# Patient Record
Sex: Female | Born: 1955 | Race: Black or African American | Hispanic: No | Marital: Single | Smoking: Current every day smoker
Health system: Southern US, Community
[De-identification: ages and names within clinical notes are randomized; demographics above are authoritative.]

## PROBLEM LIST (undated history)

## (undated) DIAGNOSIS — Z794 Long term (current) use of insulin: Secondary | ICD-10-CM

## (undated) DIAGNOSIS — J449 Chronic obstructive pulmonary disease, unspecified: Secondary | ICD-10-CM

## (undated) DIAGNOSIS — E039 Hypothyroidism, unspecified: Secondary | ICD-10-CM

## (undated) DIAGNOSIS — N840 Polyp of corpus uteri: Secondary | ICD-10-CM

## (undated) DIAGNOSIS — E785 Hyperlipidemia, unspecified: Secondary | ICD-10-CM

## (undated) DIAGNOSIS — E079 Disorder of thyroid, unspecified: Secondary | ICD-10-CM

## (undated) DIAGNOSIS — I10 Essential (primary) hypertension: Secondary | ICD-10-CM

## (undated) DIAGNOSIS — M1990 Unspecified osteoarthritis, unspecified site: Secondary | ICD-10-CM

## (undated) DIAGNOSIS — Z8742 Personal history of other diseases of the female genital tract: Secondary | ICD-10-CM

## (undated) DIAGNOSIS — M5136 Other intervertebral disc degeneration, lumbar region: Secondary | ICD-10-CM

## (undated) DIAGNOSIS — N950 Postmenopausal bleeding: Secondary | ICD-10-CM

## (undated) DIAGNOSIS — Z973 Presence of spectacles and contact lenses: Secondary | ICD-10-CM

## (undated) DIAGNOSIS — N393 Stress incontinence (female) (male): Secondary | ICD-10-CM

## (undated) DIAGNOSIS — Z9989 Dependence on other enabling machines and devices: Secondary | ICD-10-CM

## (undated) DIAGNOSIS — M51369 Other intervertebral disc degeneration, lumbar region without mention of lumbar back pain or lower extremity pain: Secondary | ICD-10-CM

## (undated) DIAGNOSIS — D259 Leiomyoma of uterus, unspecified: Secondary | ICD-10-CM

## (undated) DIAGNOSIS — Z6841 Body Mass Index (BMI) 40.0 and over, adult: Secondary | ICD-10-CM

## (undated) DIAGNOSIS — E559 Vitamin D deficiency, unspecified: Secondary | ICD-10-CM

## (undated) DIAGNOSIS — E119 Type 2 diabetes mellitus without complications: Secondary | ICD-10-CM

## (undated) DIAGNOSIS — IMO0002 Reserved for concepts with insufficient information to code with codable children: Secondary | ICD-10-CM

## (undated) DIAGNOSIS — T8859XA Other complications of anesthesia, initial encounter: Secondary | ICD-10-CM

## (undated) DIAGNOSIS — R7303 Prediabetes: Secondary | ICD-10-CM

## (undated) DIAGNOSIS — R2681 Unsteadiness on feet: Secondary | ICD-10-CM

## (undated) HISTORY — DX: Type 2 diabetes mellitus without complications: E11.9

## (undated) HISTORY — DX: Unspecified osteoarthritis, unspecified site: M19.90

## (undated) HISTORY — DX: Hyperlipidemia, unspecified: E78.5

## (undated) HISTORY — DX: Reserved for concepts with insufficient information to code with codable children: IMO0002

## (undated) HISTORY — DX: Disorder of thyroid, unspecified: E07.9

---

## 2001-12-04 ENCOUNTER — Ambulatory Visit (HOSPITAL_COMMUNITY): Admission: RE | Admit: 2001-12-04 | Discharge: 2001-12-04 | Payer: Self-pay | Admitting: Family Medicine

## 2003-05-14 ENCOUNTER — Emergency Department (HOSPITAL_COMMUNITY): Admission: EM | Admit: 2003-05-14 | Discharge: 2003-05-14 | Payer: Self-pay | Admitting: Emergency Medicine

## 2003-12-27 ENCOUNTER — Emergency Department (HOSPITAL_COMMUNITY): Admission: EM | Admit: 2003-12-27 | Discharge: 2003-12-27 | Payer: Self-pay | Admitting: Emergency Medicine

## 2010-12-24 ENCOUNTER — Emergency Department (HOSPITAL_COMMUNITY): Payer: Medicaid Other

## 2010-12-24 ENCOUNTER — Emergency Department (HOSPITAL_COMMUNITY)
Admission: EM | Admit: 2010-12-24 | Discharge: 2010-12-24 | Disposition: A | Discharge status: Home / Self Care | Payer: Medicaid Other | Attending: Emergency Medicine | Admitting: Emergency Medicine

## 2010-12-24 DIAGNOSIS — H538 Other visual disturbances: Secondary | ICD-10-CM | POA: Insufficient documentation

## 2010-12-24 DIAGNOSIS — H209 Unspecified iridocyclitis: Secondary | ICD-10-CM | POA: Insufficient documentation

## 2010-12-24 DIAGNOSIS — H5460 Unqualified visual loss, one eye, unspecified: Secondary | ICD-10-CM | POA: Insufficient documentation

## 2010-12-24 DIAGNOSIS — I10 Essential (primary) hypertension: Secondary | ICD-10-CM | POA: Insufficient documentation

## 2010-12-24 LAB — GLUCOSE, CAPILLARY: Glucose-Capillary: 122 mg/dL — ABNORMAL HIGH (ref 70–99)

## 2010-12-24 LAB — ANGIOTENSIN CONVERTING ENZYME: Angiotensin-Converting Enzyme: 48 U/L (ref 8–52)

## 2011-03-15 ENCOUNTER — Inpatient Hospital Stay (INDEPENDENT_AMBULATORY_CARE_PROVIDER_SITE_OTHER)
Admission: RE | Admit: 2011-03-15 | Discharge: 2011-03-15 | Disposition: A | Discharge status: Home / Self Care | Payer: Self-pay | Source: Ambulatory Visit | Attending: Family Medicine | Admitting: Family Medicine

## 2011-03-15 ENCOUNTER — Ambulatory Visit (INDEPENDENT_AMBULATORY_CARE_PROVIDER_SITE_OTHER): Payer: Self-pay

## 2011-03-15 DIAGNOSIS — IMO0002 Reserved for concepts with insufficient information to code with codable children: Secondary | ICD-10-CM

## 2011-03-22 NOTE — Consult Note (Signed)
[name], [name]               ACCOUNT NO.:  [id number]  MEDICAL RECORD NO.:  [id number]           PATIENT TYPE:  E  LOCATION:  [facility]                         FACILITY:  [facility]  PHYSICIAN:  Jeanne Highland. Goerge Mohr, M.D.   DATE OF BIRTH:  [birth year]  DATE OF CONSULTATION:  12/24/2010 DATE OF DISCHARGE:  12/24/2010                                CONSULTATION   ER CONSULTATION  A 55 year old black woman with 1-day history of profound vision loss left eye, onset suddenly and painlessly, no trauma to left eye prior to this event, the left eye her better eye prior to this, 2 day's onset with painless loss of vision, left eye.  PAST OCULAR HISTORY:  High myopia followed by Dr. Donalee [name] in [place], [place] with glasses as she is highly myopic.  She has had no previous ocular surgeries.  MEDICATIONS:  Oral Aleve or Advil p.r.n. right knee arthritis, osteoarthritis while she is at work upright and standing with her obese condition.  PHYSICAL EXAMINATION:  She was tested by the ER staff prior to my arrival, 20/50 right eye with glasses.  On the left eye was bare hand motion confirmed by me.  Pupils on the right eye are normal with no afferent pupillary defect directly or by reverse testing.  On the left eye, there is no movement, appears to be a secluded pupil.  Motility is normal.  Slit-lamp examination of the right eye is entirely normal. Left eye shows evidence of trace areas of the cornea, 3+ injection of the conjunctiva, plaque of white cells overlying the pupil, a secluded pupil, nearly 360 synechiae over the pupil although there appeared to be clefts which upon later dilation there is some clefting __________prevent the pupil iris bombe.  Dilation was then performed.  The right eye is again normal.  Retina is attached.  There are mild __________ macular changes, mild papillary changes, no holes or tears in the right eye and the vitreous is clear. The choroid and retina  are  normal in the right eye.  There is no view posteriorly through the plaque of the left eye.  Notably, the anterior chamber also had a trace to 1 mm hypopyon inferiorly.  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  This requires laboratory testing with CBC, sed rate, ACE, lysozyme, and skin test for PPD to be placed today.  To allow and to enhance clearing of the inflammatory component, topical steroids to be used on hourly basis while she is awake.  In addition to that, to confirm, maintain and improve and hasten its resolution, 1 mL of 40 mg/mL of Kenalog in retroseptal fashion through the lower lid atraumatically without pain.  The patient will be discharged home with topical eye drops and requested to come to see in the office in 3 days for followup.     Jeanne [name], M.D.     GAR/MEDQ  D:  12/24/2010  T:  12/25/2010  Job:  [id number]  Electronically Signed by Fawn Kirk M.D. on 03/22/2011 04:54:09  PM

## 2011-04-10 ENCOUNTER — Other Ambulatory Visit: Payer: Self-pay | Admitting: Internal Medicine

## 2011-04-10 DIAGNOSIS — Z1231 Encounter for screening mammogram for malignant neoplasm of breast: Secondary | ICD-10-CM

## 2011-04-20 ENCOUNTER — Ambulatory Visit: Payer: Medicaid Other

## 2011-04-25 ENCOUNTER — Ambulatory Visit
Admission: RE | Admit: 2011-04-25 | Discharge: 2011-04-25 | Disposition: A | Discharge status: Home / Self Care | Payer: Medicaid Other | Source: Ambulatory Visit | Attending: Internal Medicine | Admitting: Internal Medicine

## 2011-04-25 DIAGNOSIS — Z1231 Encounter for screening mammogram for malignant neoplasm of breast: Secondary | ICD-10-CM

## 2011-05-01 ENCOUNTER — Other Ambulatory Visit: Payer: Self-pay | Admitting: Internal Medicine

## 2011-05-01 DIAGNOSIS — R928 Other abnormal and inconclusive findings on diagnostic imaging of breast: Secondary | ICD-10-CM

## 2011-05-10 ENCOUNTER — Ambulatory Visit
Admission: RE | Admit: 2011-05-10 | Discharge: 2011-05-10 | Disposition: A | Discharge status: Home / Self Care | Payer: Medicaid Other | Source: Ambulatory Visit | Attending: Internal Medicine | Admitting: Internal Medicine

## 2011-05-10 ENCOUNTER — Other Ambulatory Visit: Payer: Medicaid Other

## 2011-05-10 DIAGNOSIS — R928 Other abnormal and inconclusive findings on diagnostic imaging of breast: Secondary | ICD-10-CM

## 2011-06-15 ENCOUNTER — Other Ambulatory Visit: Payer: Self-pay | Admitting: Orthopedic Surgery

## 2011-06-15 DIAGNOSIS — M545 Low back pain: Secondary | ICD-10-CM

## 2011-06-18 ENCOUNTER — Ambulatory Visit
Admission: RE | Admit: 2011-06-18 | Discharge: 2011-06-18 | Disposition: A | Discharge status: Home / Self Care | Payer: Medicaid Other | Source: Ambulatory Visit | Attending: Orthopedic Surgery | Admitting: Orthopedic Surgery

## 2011-06-18 ENCOUNTER — Other Ambulatory Visit: Payer: Medicaid Other

## 2011-06-18 DIAGNOSIS — M545 Low back pain: Secondary | ICD-10-CM

## 2011-09-16 ENCOUNTER — Emergency Department (HOSPITAL_COMMUNITY)
Admission: EM | Admit: 2011-09-16 | Discharge: 2011-09-17 | Disposition: A | Discharge status: Home / Self Care | Payer: Self-pay | Attending: Emergency Medicine | Admitting: Emergency Medicine

## 2011-09-16 ENCOUNTER — Encounter (HOSPITAL_COMMUNITY): Payer: Self-pay | Admitting: Emergency Medicine

## 2011-09-16 DIAGNOSIS — H53149 Visual discomfort, unspecified: Secondary | ICD-10-CM | POA: Insufficient documentation

## 2011-09-16 DIAGNOSIS — H5710 Ocular pain, unspecified eye: Secondary | ICD-10-CM | POA: Insufficient documentation

## 2011-09-16 DIAGNOSIS — Z79899 Other long term (current) drug therapy: Secondary | ICD-10-CM | POA: Insufficient documentation

## 2011-09-16 DIAGNOSIS — H15009 Unspecified scleritis, unspecified eye: Secondary | ICD-10-CM | POA: Insufficient documentation

## 2011-09-16 DIAGNOSIS — I10 Essential (primary) hypertension: Secondary | ICD-10-CM | POA: Insufficient documentation

## 2011-09-16 DIAGNOSIS — H15001 Unspecified scleritis, right eye: Secondary | ICD-10-CM

## 2011-09-16 DIAGNOSIS — H5789 Other specified disorders of eye and adnexa: Secondary | ICD-10-CM | POA: Insufficient documentation

## 2011-09-16 HISTORY — DX: Essential (primary) hypertension: I10

## 2011-09-16 HISTORY — DX: Prediabetes: R73.03

## 2011-09-16 NOTE — ED Notes (Signed)
Patient with right eye pain, drainage, injected.  Patient has been using drops that she has had at home.

## 2011-09-16 NOTE — ED Notes (Signed)
EDP at bedside  

## 2011-09-17 MED ORDER — ERYTHROMYCIN 5 MG/GM OP OINT: TOPICAL_OINTMENT | OPHTHALMIC | Status: AC

## 2011-09-17 MED ORDER — TETRACAINE HCL 0.5 % OP SOLN
1.0000 [drp] | Freq: Once | OPHTHALMIC | Status: AC
  Administered 2011-09-17: 1 [drp] via OPHTHALMIC
  Filled 2011-09-17: qty 2

## 2011-09-17 MED ORDER — FLUORESCEIN SODIUM 1 MG OP STRP
1.0000 | ORAL_STRIP | Freq: Once | OPHTHALMIC | Status: AC
  Administered 2011-09-17: 1 via OPHTHALMIC
  Filled 2011-09-17: qty 1

## 2011-09-17 MED ORDER — HYDROCODONE-ACETAMINOPHEN 5-325 MG PO TABS
1.0000 | ORAL_TABLET | Freq: Once | ORAL | Status: AC
  Administered 2011-09-17: 1 via ORAL
  Filled 2011-09-17: qty 1

## 2011-09-17 MED ORDER — HYDROCODONE-ACETAMINOPHEN 5-325 MG PO TABS: 1.0000 | ORAL_TABLET | Freq: Four times a day (QID) | ORAL | Status: AC | PRN

## 2011-09-17 NOTE — ED Provider Notes (Signed)
History     CSN: [id number]  Arrival date & time 09/16/11  2239   First MD Initiated Contact with Patient 09/16/11 2316      Chief Complaint  Patient presents with  . Eye Pain    (Consider location/radiation/quality/duration/timing/severity/associated sxs/prior treatment) The history is provided by the patient.   right eye pain and irritation for 5 days. Similar problem approximately one to 2 years ago without specific findings. Patient wears eyeglasses and has an optometrist but they would not see her, she does not wear contacts. No purulent discharge some mild photophobia pain around the right eye socket no redness or swelling. No nausea vomiting no fever. No history of trauma to the eye or foreign body. Eye pain is 10 out of 10. Improves some with Motrin that she's taken at home. Pain is described as throbbing sharp.  Past Medical History  Diagnosis Date  . Borderline diabetic   . Hypertension     Past Surgical History  Procedure Date  . Cesarean section     No family history on file.  History  Substance Use Topics  . Smoking status: Current Everyday Smoker    Types: Cigarettes  . Smokeless tobacco: Not on file  . Alcohol Use: No    OB History    Grav Para Term Preterm Abortions TAB SAB Ect Mult Living                  Review of Systems  Constitutional: Negative for fever.  HENT: Negative for congestion and neck pain.   Eyes: Positive for photophobia, pain and redness. Negative for discharge.  Respiratory: Negative for cough and shortness of breath.   Cardiovascular: Negative for chest pain.  Gastrointestinal: Negative for abdominal pain.  Genitourinary: Negative for dysuria.  Musculoskeletal: Negative for back pain.  Neurological: Negative for dizziness, facial asymmetry, speech difficulty and headaches.  Hematological: Does not bruise/bleed easily.    Allergies  Review of patient's allergies indicates no known allergies.  Home Medications   Current  Outpatient Rx  Name Route Sig Dispense Refill  . CYCLOBENZAPRINE HCL 10 MG PO TABS Oral Take 10 mg by mouth at bedtime as needed. For sore muscles    . DICLOFENAC SODIUM 25 MG PO TBEC Oral Take 25 mg by mouth 2 (two) times daily.    . IBUPROFEN 200 MG PO TABS Oral Take 600 mg by mouth every 8 (eight) hours as needed. For pain    . LISINOPRIL 20 MG PO TABS Oral Take 20 mg by mouth daily.    Marland Kitchen PREDNISOLONE ACETATE 1 % OP SUSP Right Eye Place 1 drop into the right eye once.    . TETRAHYDROZOLINE HCL 0.05 % OP SOLN Right Eye Place 1 drop into the right eye 4 (four) times daily.    . ERYTHROMYCIN 5 MG/GM OP OINT  Place a 1/2 inch ribbon of ointment into the lower eyelid. 1 g 0  . HYDROCODONE-ACETAMINOPHEN 5-325 MG PO TABS Oral Take 1-2 tablets by mouth every 6 (six) hours as needed for pain. 10 tablet 0    BP 151/90  Pulse 100  Temp(Src) 98.1 F (36.7 C) (Oral)  Resp 16  SpO2 94%  Physical Exam  Nursing note and vitals reviewed. Constitutional: She is oriented to person, place, and time. She appears well-developed and well-nourished.  HENT:  Head: Normocephalic and atraumatic.  Mouth/Throat: Oropharynx is clear and moist.  Eyes: EOM are normal. Pupils are equal, round, and reactive to light. Right eye exhibits  no discharge. Left eye exhibits no discharge. No scleral icterus.       Right eye lids not swollen no tenderness palpation no evidence of stye no hyphema, no purulent discharge, sclera is red in the eyes injected, by slit lamp with fluorosein staining no corneal abrasion or ulcer anterior chamber normal no hyphema, ocular pressure checked and it was 16.   Neck: Normal range of motion. Neck supple.  Cardiovascular: Normal rate, regular rhythm and normal heart sounds.   No murmur heard. Pulmonary/Chest: Effort normal and breath sounds normal.  Abdominal: Soft. Bowel sounds are normal. There is no tenderness.  Musculoskeletal: Normal range of motion. She exhibits no tenderness.    Neurological: She is alert and oriented to person, place, and time. No cranial nerve deficit. She exhibits normal muscle tone. Coordination normal.  Skin: Skin is warm. No rash noted. No erythema.    ED Course  Procedures (including critical care time)  Labs Reviewed - No data to display No results found.   1. Scleritis of right eye       MDM   Patient with pain and redness without purulent discharge to the right eye. Extensive workup in the emergency department to include slit lamp examination with a normal anterior chamber no corneal abrasion I done without relief of the pain and the fluorosein dye was used. Considered it being an iritis but shining a light into the left eye does not cause pain in the right eye. Extraocular muscles are intact pupils are equal reactive round to light and accommodate. Ocular pressure checked in the right eye it was 16. Therefore no evidence of glaucoma. Suspect inflammation of the sclera no swelling of the lids no pain with extraocular motion.  Based on today's workup no evidence of corneal abrasion or ulcer not consistent with an iritis not consistent with conjunctivitis not consistent with glaucoma. Suspect inflammation of the sclera will treat with erythromycin ointment have patient stop the Pred Forte drops that she started on Wednesday and referral given to ophthalmology.        Shelda Jakes, MD 09/17/11 [phone]

## 2011-09-17 NOTE — ED Notes (Signed)
EDP at bedside  

## 2011-12-05 ENCOUNTER — Ambulatory Visit (INDEPENDENT_AMBULATORY_CARE_PROVIDER_SITE_OTHER): Payer: Self-pay | Admitting: *Deleted

## 2011-12-05 ENCOUNTER — Other Ambulatory Visit: Payer: Self-pay | Admitting: Obstetrics and Gynecology

## 2011-12-05 VITALS — BP 135/83 | HR 100 | Temp 99.1°F | Ht 69.0 in | Wt 369.3 lb

## 2011-12-05 DIAGNOSIS — N898 Other specified noninflammatory disorders of vagina: Secondary | ICD-10-CM

## 2011-12-05 DIAGNOSIS — N63 Unspecified lump in unspecified breast: Secondary | ICD-10-CM

## 2011-12-05 DIAGNOSIS — Z01419 Encounter for gynecological examination (general) (routine) without abnormal findings: Secondary | ICD-10-CM

## 2011-12-05 NOTE — Patient Instructions (Addendum)
Taught patient how to perform BSE and gave educational materials to take home. Let her know [facility] will cover Pap smears every 3 years unless has a history of abnormal Pap smears. Patient referred to the [facility] for follow up right breast diagnostic mammogram. Appointment scheduled Thursday, December 07, 2011 at 0920. Patient aware of appointment and will be there. Let patient know will follow up with her within the next couple weeks with results. Patient verbalized understanding.

## 2011-12-05 NOTE — Progress Notes (Signed)
Complaints of dry irritated skin under breasts and inside of thighs. Patient referred from the [facility] for 6 month follow up right breast diagnostic mammogram from 05/10/11.  Pap Smear:    Completed Pap smear today. Per patient last Pap smear was 10 years ago and normal. Per patient has a history of abnormal Pap smears that have only required repeat Pap smears. No Pap smear results in EPIC.  Physical exam: Breasts Breasts symmetrical. Dry irritated skin that is broken in areas under bilateral breasts that has a yeast like appearance. No nipple retraction bilateral breasts. No nipple discharge bilateral breasts. No lymphadenopathy. No lumps palpated bilateral breasts. Patient referred to the [facility] for follow up right breast diagnostic mammogram. Appointment scheduled Thursday, December 07, 2011 at 0920.          Pelvic/Bimanual   Ext Genitalia No lesions, no swelling and no discharge observed on external genitalia. Skin dry and irritated with the same yeast like appearance under breasts.        Vagina Vagina pink and normal texture. No lesions and thick yellow discharge observed in vagina. Wet prep completed.          Cervix Cervix is present. Cervix pink and of normal texture. Whitish colored discharge observed at cervical os.          Uterus Uterus is present and difficult to palpate due to patient being morbidly obese. Uterus in normal position and felt to be of normal size.       Adnexae Bilateral ovaries present and unable to palpate due to obesity. No tenderness on palpation.        Rectovaginal No rectal exam completed today since patient had no rectal complaints. No skin abnormalities observed on exam.

## 2011-12-07 ENCOUNTER — Ambulatory Visit
Admission: RE | Admit: 2011-12-07 | Discharge: 2011-12-07 | Disposition: A | Discharge status: Home / Self Care | Payer: No Typology Code available for payment source | Source: Ambulatory Visit | Attending: Obstetrics and Gynecology | Admitting: Obstetrics and Gynecology

## 2011-12-07 ENCOUNTER — Telehealth: Payer: Self-pay | Admitting: *Deleted

## 2011-12-07 ENCOUNTER — Other Ambulatory Visit: Payer: Self-pay | Admitting: Obstetrics and Gynecology

## 2011-12-07 DIAGNOSIS — N63 Unspecified lump in unspecified breast: Secondary | ICD-10-CM

## 2011-12-07 LAB — WET PREP, GENITAL: Trich, Wet Prep: NONE SEEN

## 2011-12-07 MED ORDER — FLUCONAZOLE 150 MG PO TABS: 150.0000 mg | ORAL_TABLET | Freq: Once | ORAL | Status: AC

## 2011-12-07 MED ORDER — METRONIDAZOLE 500 MG PO TABS: 500.0000 mg | ORAL_TABLET | Freq: Two times a day (BID) | ORAL | Status: AC

## 2011-12-07 NOTE — Telephone Encounter (Signed)
Called patient and let patient know her Pap smear was normal. Gave her results to wet prep letting her know it showed yeast and BV. Told patient prescriptions have been sent to her pharmacy for Diflucan and Flagyl for her to pick. Told patient to avoid alcohol while taking Flagyl. Followed up with patient about results to diagnostic mammogram today. Reminded her she will need a diagnostic mammogram and right breast ultrasound in 6 months the end of October. Let her know [facility] will cover and that she can call the [facility] directly to schedule. Patient verbalized understanding.

## 2011-12-07 NOTE — Progress Notes (Signed)
Addended by: Catalina Antigua on: 12/07/2011 09:26 AM   Modules accepted: Orders

## 2012-01-13 ENCOUNTER — Emergency Department (INDEPENDENT_AMBULATORY_CARE_PROVIDER_SITE_OTHER)
Admission: EM | Admit: 2012-01-13 | Discharge: 2012-01-13 | Disposition: A | Discharge status: Home / Self Care | Payer: Self-pay | Source: Home / Self Care | Attending: Emergency Medicine | Admitting: Emergency Medicine

## 2012-01-13 ENCOUNTER — Encounter (HOSPITAL_COMMUNITY): Payer: Self-pay | Admitting: *Deleted

## 2012-01-13 DIAGNOSIS — J40 Bronchitis, not specified as acute or chronic: Secondary | ICD-10-CM

## 2012-01-13 DIAGNOSIS — J029 Acute pharyngitis, unspecified: Secondary | ICD-10-CM

## 2012-01-13 MED ORDER — AMOXICILLIN 500 MG PO CAPS: 500.0000 mg | ORAL_CAPSULE | Freq: Three times a day (TID) | ORAL | Status: AC

## 2012-01-13 MED ORDER — GUAIFENESIN-CODEINE 100-10 MG/5ML PO SYRP: 5.0000 mL | ORAL_SOLUTION | Freq: Three times a day (TID) | ORAL | Status: AC | PRN

## 2012-01-13 NOTE — ED Notes (Signed)
Pt with c/o sore throat /cough/congestion 10  Days  - per pt coughing more at night - coughing spells causing vomiting - felt better yesterday worse today -

## 2012-01-13 NOTE — Discharge Instructions (Signed)
Bronchitis Bronchitis is a problem of the air tubes leading to your lungs. This problem makes it hard for air to get in and out of the lungs. You may cough a lot because your air tubes are narrow. Going without care can cause lasting (chronic) bronchitis. HOME CARE   Drink enough fluids to keep your pee (urine) clear or pale yellow.   Use a cool mist humidifier.   Quit smoking if you smoke. If you keep smoking, the bronchitis might not get better.   Only take medicine as told by your doctor.  GET HELP RIGHT AWAY IF:   Coughing keeps you awake.   You start to wheeze.   You become more sick or weak.   You have a hard time breathing or get short of breath.   You cough up blood.   Coughing lasts more than 2 weeks.   You have a fever.   Your baby is older than 3 months with a rectal temperature of 102 F (38.9 C) or higher.   Your baby is 38 months old or younger with a rectal temperature of 100.4 F (38 C) or higher.  MAKE SURE YOU:  Understand these instructions.   Will watch your condition.   Will get help right away if you are not doing well or get worse.  Document Released: 01/17/2008 Document Revised: 07/20/2011 Document Reviewed: 07/02/2009 [facility] Patient Information 2012 [place], [place].Pharyngitis, Viral and Bacterial Pharyngitis is soreness (inflammation) or infection of the pharynx. It is also called a sore throat. CAUSES  Most sore throats are caused by viruses and are part of a cold. However, some sore throats are caused by strep and other bacteria. Sore throats can also be caused by post nasal drip from draining sinuses, allergies and sometimes from sleeping with an open mouth. Infectious sore throats can be spread from person to person by coughing, sneezing and sharing cups or eating utensils. TREATMENT  Sore throats that are viral usually last 3-4 days. Viral illness will get better without medications (antibiotics). Strep throat and other bacterial infections  will usually begin to get better about 24-48 hours after you begin to take antibiotics. HOME CARE INSTRUCTIONS   If the caregiver feels there is a bacterial infection or if there is a positive strep test, they will prescribe an antibiotic. The full course of antibiotics must be taken. If the full course of antibiotic is not taken, you or your child may become ill again. If you or your child has strep throat and do not finish all of the medication, serious heart or kidney diseases may develop.   Drink enough water and fluids to keep your urine clear or pale yellow.   Only take over-the-counter or prescription medicines for pain, discomfort or fever as directed by your caregiver.   Get lots of rest.   Gargle with salt water ( tsp. of salt in a glass of water) as often as every 1-2 hours as you need for comfort.   Hard candies may soothe the throat if individual is not at risk for choking. Throat sprays or lozenges may also be used.  SEEK MEDICAL CARE IF:   Large, tender lumps in the neck develop.   A rash develops.   Green, yellow-brown or bloody sputum is coughed up.   Your baby is older than 3 months with a rectal temperature of 100.5 F (38.1 C) or higher for more than 1 day.  SEEK IMMEDIATE MEDICAL CARE IF:   A stiff neck develops.  You or your child are drooling or unable to swallow liquids.   You or your child are vomiting, unable to keep medications or liquids down.   You or your child has severe pain, unrelieved with recommended medications.   You or your child are having difficulty breathing (not due to stuffy nose).   You or your child are unable to fully open your mouth.   You or your child develop redness, swelling, or severe pain anywhere on the neck.   You have a fever.   Your baby is older than 3 months with a rectal temperature of 102 F (38.9 C) or higher.   Your baby is 41 months old or younger with a rectal temperature of 100.4 F (38 C) or higher.    MAKE SURE YOU:   Understand these instructions.   Will watch your condition.   Will get help right away if you are not doing well or get worse.  Document Released: 07/31/2005 Document Revised: 07/20/2011 Document Reviewed: 10/28/2007 [facility] Patient Information 2012 [place], [place].

## 2012-01-13 NOTE — ED Provider Notes (Signed)
History     CSN: [id number]  Arrival date & time 01/13/12  1746   First MD Initiated Contact with Patient 01/13/12 1750      Chief Complaint  Patient presents with  . Sore Throat  . Cough  . Nasal Congestion    (Consider location/radiation/quality/duration/timing/severity/associated sxs/prior treatment) HPI Comments: Been coughing for about 10 days the cough has become progressively worse and now bringing up yellow to green phlegm. My chest feels congested have no shortness of breath or wheezing. At night get coughing spells to the point that makes me vomit. For the last 2 days moist throat has been hurting more so in the left side and when I swallow in the left side of my neck hurts. No shortness of breath, no abdominal pain no fevers that I can tell.  Patient is a 56 y.o. female presenting with pharyngitis and cough. The history is provided by the patient.  Sore Throat This is a recurrent problem. The current episode started more than 1 week ago. The problem occurs constantly. The problem has been gradually worsening. Pertinent negatives include no chest pain, no abdominal pain and no shortness of breath. The symptoms are relieved by nothing. She has tried nothing for the symptoms.  Cough Associated symptoms include sore throat. Pertinent negatives include no chest pain, no chills and no shortness of breath.    Past Medical History  Diagnosis Date  . Borderline diabetic   . Hypertension   . Hyperlipidemia   . Arthritis   . Abnormal Pap smear     Past Surgical History  Procedure Date  . Cesarean section     Family History  Problem Relation Age of Onset  . Hypertension Maternal Grandmother   . Alcohol abuse Maternal Grandmother   . Diabetes Mother   . Heart disease Mother   . Hypertension Mother   . Alcohol abuse Mother   . Heart disease Sister   . Hypertension Maternal Aunt   . Alcohol abuse Brother   . Alcohol abuse Sister     History  Substance Use Topics  .  Smoking status: Current Everyday Smoker -- 1.0 packs/day for 41 years    Types: Cigarettes  . Smokeless tobacco: Never Used  . Alcohol Use: No    OB History    Grav Para Term Preterm Abortions TAB SAB Ect Mult Living   2 2 2       2       Review of Systems  Constitutional: Negative for fever, chills, activity change, appetite change and fatigue.  HENT: Positive for congestion and sore throat. Negative for mouth sores, neck pain and sinus pressure.   Respiratory: Positive for cough. Negative for apnea, chest tightness and shortness of breath.   Cardiovascular: Negative for chest pain and palpitations.  Gastrointestinal: Negative for abdominal pain and anal bleeding.  Skin: Negative for rash.    Allergies  Review of patient's allergies indicates no known allergies.  Home Medications   Current Outpatient Rx  Name Route Sig Dispense Refill  . CYCLOBENZAPRINE HCL 10 MG PO TABS Oral Take 10 mg by mouth at bedtime as needed. For sore muscles    . DICLOFENAC SODIUM 25 MG PO TBEC Oral Take 25 mg by mouth 2 (two) times daily.    Marland Kitchen BENADRYL ALLERGY PO Oral Take by mouth.    . IBUPROFEN 200 MG PO TABS Oral Take 600 mg by mouth every 8 (eight) hours as needed. For pain    . LISINOPRIL 20  MG PO TABS Oral Take 20 mg by mouth daily.    Marland Kitchen LISINOPRIL-HYDROCHLOROTHIAZIDE 20-12.5 MG PO TABS Oral Take 1 tablet by mouth daily.    . NYQUIL PO Oral Take by mouth.    . DAYQUIL PO Oral Take by mouth.    . AMOXICILLIN 500 MG PO CAPS Oral Take 1 capsule (500 mg total) by mouth 3 (three) times daily. 30 capsule 0  . GUAIFENESIN-CODEINE 100-10 MG/5ML PO SYRP Oral Take 5 mLs by mouth 3 (three) times daily as needed for cough or congestion. 120 mL 0  . PREDNISOLONE ACETATE 1 % OP SUSP Right Eye Place 1 drop into the right eye once.    . TETRAHYDROZOLINE HCL 0.05 % OP SOLN Right Eye Place 1 drop into the right eye 4 (four) times daily.      BP 132/87  Pulse 104  Temp(Src) 98.8 F (37.1 C) (Oral)  Resp 20   SpO2 97%  Physical Exam  Nursing note and vitals reviewed. Constitutional: Vital signs are normal.  Non-toxic appearance. She does not have a sickly appearance. No distress.  HENT:  Head: Normocephalic.  Mouth/Throat: Uvula is midline and mucous membranes are normal. Posterior oropharyngeal erythema present. No oropharyngeal exudate or tonsillar abscesses.  Eyes: Conjunctivae are normal.  Pulmonary/Chest: Effort normal. No respiratory distress. She has decreased breath sounds. She has no wheezes. She has no rales.   She exhibits no tenderness.  Lymphadenopathy:    She has no cervical adenopathy.  Neurological: She is alert.    ED Course  Procedures (including critical care time)   Labs Reviewed  POCT RAPID STREP A ([facility] CARE ONLY)   No results found.   1. Bronchitis   2. Pharyngitis       MDM  Patient with productive cough for 10 days. On exam unilateral pharyngitis with reactive lymphadenopathy so the left anterior cervical chain. Decided to start patient on antibiotics encourage her to followup with primary care Dr. or [place] if no improvement is noted after 5-7 days. Patient agree with treatment plan and followup care as necessary.        Jimmie Molly, MD 01/13/12 [phone]

## 2012-05-02 ENCOUNTER — Other Ambulatory Visit: Payer: Self-pay | Admitting: Obstetrics and Gynecology

## 2012-05-02 ENCOUNTER — Telehealth (HOSPITAL_COMMUNITY): Payer: Self-pay | Admitting: *Deleted

## 2012-05-02 DIAGNOSIS — R922 Inconclusive mammogram: Secondary | ICD-10-CM

## 2012-05-02 NOTE — Telephone Encounter (Signed)
Telephoned patient at home # and spoke with patient. Advised of diagnostic bilateral mammogram with ultrasound that needed to be scheduled in Oct. Patient will call and schedule.

## 2012-05-16 ENCOUNTER — Ambulatory Visit
Admission: RE | Admit: 2012-05-16 | Discharge: 2012-05-16 | Disposition: A | Discharge status: Home / Self Care | Payer: No Typology Code available for payment source | Source: Ambulatory Visit | Attending: Obstetrics and Gynecology | Admitting: Obstetrics and Gynecology

## 2012-05-16 DIAGNOSIS — R9230 Dense breasts, unspecified: Secondary | ICD-10-CM

## 2012-05-16 DIAGNOSIS — R922 Inconclusive mammogram: Secondary | ICD-10-CM

## 2012-06-19 ENCOUNTER — Telehealth (HOSPITAL_COMMUNITY): Payer: Self-pay | Admitting: *Deleted

## 2012-06-19 NOTE — Telephone Encounter (Signed)
Patient returned call and advised mammogram was benign and recommendation was for bilateral diagnostic mammogram in one year. Patient voiced understanding.

## 2013-08-21 ENCOUNTER — Other Ambulatory Visit: Payer: Self-pay | Admitting: Internal Medicine

## 2013-08-21 DIAGNOSIS — N63 Unspecified lump in unspecified breast: Secondary | ICD-10-CM

## 2013-08-29 ENCOUNTER — Ambulatory Visit
Admission: RE | Admit: 2013-08-29 | Discharge: 2013-08-29 | Disposition: A | Discharge status: Home / Self Care | Payer: Medicare HMO | Source: Ambulatory Visit | Attending: Internal Medicine | Admitting: Internal Medicine

## 2013-08-29 DIAGNOSIS — N63 Unspecified lump in unspecified breast: Secondary | ICD-10-CM

## 2014-06-15 ENCOUNTER — Encounter (HOSPITAL_COMMUNITY): Payer: Self-pay | Admitting: *Deleted

## 2014-11-16 ENCOUNTER — Other Ambulatory Visit: Payer: Self-pay

## 2014-11-16 DIAGNOSIS — Z1231 Encounter for screening mammogram for malignant neoplasm of breast: Secondary | ICD-10-CM

## 2014-11-20 ENCOUNTER — Ambulatory Visit
Admission: RE | Admit: 2014-11-20 | Discharge: 2014-11-20 | Disposition: A | Discharge status: Home / Self Care | Payer: Medicare HMO | Source: Ambulatory Visit

## 2014-11-20 DIAGNOSIS — Z1231 Encounter for screening mammogram for malignant neoplasm of breast: Secondary | ICD-10-CM

## 2014-12-10 ENCOUNTER — Other Ambulatory Visit: Payer: Self-pay | Admitting: Internal Medicine

## 2014-12-10 DIAGNOSIS — E2839 Other primary ovarian failure: Secondary | ICD-10-CM

## 2014-12-16 ENCOUNTER — Ambulatory Visit
Admission: RE | Admit: 2014-12-16 | Discharge: 2014-12-16 | Disposition: A | Discharge status: Home / Self Care | Payer: Medicare HMO | Source: Ambulatory Visit | Attending: Internal Medicine | Admitting: Internal Medicine

## 2014-12-16 DIAGNOSIS — E2839 Other primary ovarian failure: Secondary | ICD-10-CM

## 2015-05-05 ENCOUNTER — Ambulatory Visit: Payer: Medicare HMO | Admitting: Podiatry

## 2015-06-22 ENCOUNTER — Ambulatory Visit: Payer: Medicare HMO | Admitting: Podiatry

## 2015-07-27 ENCOUNTER — Ambulatory Visit (INDEPENDENT_AMBULATORY_CARE_PROVIDER_SITE_OTHER): Payer: Medicare HMO | Admitting: Podiatry

## 2015-07-27 ENCOUNTER — Encounter: Payer: Self-pay | Admitting: Podiatry

## 2015-07-27 DIAGNOSIS — B351 Tinea unguium: Secondary | ICD-10-CM

## 2015-07-27 DIAGNOSIS — M79674 Pain in right toe(s): Secondary | ICD-10-CM | POA: Diagnosis not present

## 2015-07-27 DIAGNOSIS — M79675 Pain in left toe(s): Secondary | ICD-10-CM | POA: Diagnosis not present

## 2015-07-27 DIAGNOSIS — E119 Type 2 diabetes mellitus without complications: Secondary | ICD-10-CM | POA: Diagnosis not present

## 2015-07-27 DIAGNOSIS — B353 Tinea pedis: Secondary | ICD-10-CM

## 2015-07-27 NOTE — Progress Notes (Signed)
   Subjective:    Patient ID: Jeanne [name], female    DOB: 05/30/1956, 59 y.o.   MRN: [id number]  HPI   This patient presents today requesting debridement of toenails which she says they are occasionally uncomfortable especially the margin of the right hallux nail. She goes to a pedicures in the past for this service. She denies any bacterial infection or professional care for her toenails. Also, patient was complaining of itching in the third and fourth webspaces bilaterally that she applied clotrimazole/betamethasone cream daily for multiple months without any resolution of the symptoms.  Patient is a diabetic and denies any history of foot ulceration, claudication or amputation  Review of Systems  Eyes: Positive for pain, redness, itching and visual disturbance.  Gastrointestinal: Positive for constipation.  Endocrine: Positive for heat intolerance.  Musculoskeletal: Positive for back pain and gait problem.  Neurological: Positive for weakness.       Objective:   Physical Exam  Orientated 3  Vascular: DP and PT pulses 2/4 bilaterally Capillary reflex immediate bilaterally  Neurological: Sensation to 10 g monofilament wire intact 5/5 bilaterally Vibratory sensation reactive bilaterally Ankle reflex equal and reactive bilaterally  Dermatological: No open skin lesions bilaterally Dry skin bilaterally The toenails are elongated, incurvated with occasional color changes. The medial left hallux nail groove is callused The third and fourth right webspace have a white macerated appearance without any open lesions.  Musculoskeletal: There is no crepitus or pain upon range of motion of ankle, subtalar, midtarsal joints bilaterally       Assessment & Plan:   Assessment: Satisfactory neurovascular status Diabetic without foot complication Probable yeast infection third and fourth webspaces bilaterally Incurvated toenails 6-10  Plan: Today review the results of  examination with patient today. I did debride the nails without any bleeding I recommended that she apply Castellani  Paint to the webspaces twice a day 60 days. I made aware that she can purchase this product as an over-the-counter product  Reappoint 3 month

## 2015-07-27 NOTE — Patient Instructions (Signed)
Purchase over-the-counter [employer] and apply in between the third and fourth toes on the right and left feet with a Q-tip twice a day 60 days  Diabetes and Foot Care Diabetes may cause you to have problems because of poor blood supply (circulation) to your feet and legs. This may cause the skin on your feet to become thinner, break easier, and heal more slowly. Your skin may become dry, and the skin may peel and crack. You may also have nerve damage in your legs and feet causing decreased feeling in them. You may not notice minor injuries to your feet that could lead to infections or more serious problems. Taking care of your feet is one of the most important things you can do for yourself.  HOME CARE INSTRUCTIONS  Wear shoes at all times, even in the house. Do not go barefoot. Bare feet are easily injured.  Check your feet daily for blisters, cuts, and redness. If you cannot see the bottom of your feet, use a mirror or ask someone for help.  Wash your feet with warm water (do not use hot water) and mild soap. Then pat your feet and the areas between your toes until they are completely dry. Do not soak your feet as this can dry your skin.  Apply a moisturizing lotion or petroleum jelly (that does not contain alcohol and is unscented) to the skin on your feet and to dry, brittle toenails. Do not apply lotion between your toes.  Trim your toenails straight across. Do not dig under them or around the cuticle. File the edges of your nails with an emery board or nail file.  Do not cut corns or calluses or try to remove them with medicine.  Wear clean socks or stockings every day. Make sure they are not too tight. Do not wear knee-high stockings since they may decrease blood flow to your legs.  Wear shoes that fit properly and have enough cushioning. To break in new shoes, wear them for just a few hours a day. This prevents you from injuring your feet. Always look in your shoes before you put  them on to be sure there are no objects inside.  Do not cross your legs. This may decrease the blood flow to your feet.  If you find a minor scrape, cut, or break in the skin on your feet, keep it and the skin around it clean and dry. These areas may be cleansed with mild soap and water. Do not cleanse the area with peroxide, alcohol, or iodine.  When you remove an adhesive bandage, be sure not to damage the skin around it.  If you have a wound, look at it several times a day to make sure it is healing.  Do not use heating pads or hot water bottles. They may burn your skin. If you have lost feeling in your feet or legs, you may not know it is happening until it is too late.  Make sure your health care provider performs a complete foot exam at least annually or more often if you have foot problems. Report any cuts, sores, or bruises to your health care provider immediately. SEEK MEDICAL CARE IF:   You have an injury that is not healing.  You have cuts or breaks in the skin.  You have an ingrown nail.  You notice redness on your legs or feet.  You feel burning or tingling in your legs or feet.  You have pain or cramps in  your legs and feet.  Your legs or feet are numb.  Your feet always feel cold. SEEK IMMEDIATE MEDICAL CARE IF:   There is increasing redness, swelling, or pain in or around a wound.  There is a red line that goes up your leg.  Pus is coming from a wound.  You develop a fever or as directed by your health care provider.  You notice a bad smell coming from an ulcer or wound.   This information is not intended to replace advice given to you by your health care provider. Make sure you discuss any questions you have with your health care provider.   Document Released: 07/28/2000 Document Revised: 04/02/2013 Document Reviewed: 01/07/2013 Elsevier Interactive Patient Education [employer].

## 2015-10-22 DIAGNOSIS — R252 Cramp and spasm: Secondary | ICD-10-CM | POA: Diagnosis not present

## 2015-10-22 DIAGNOSIS — M1990 Unspecified osteoarthritis, unspecified site: Secondary | ICD-10-CM | POA: Diagnosis not present

## 2015-10-22 DIAGNOSIS — I10 Essential (primary) hypertension: Secondary | ICD-10-CM | POA: Diagnosis not present

## 2015-10-22 DIAGNOSIS — Z720 Tobacco use: Secondary | ICD-10-CM | POA: Diagnosis not present

## 2015-10-22 DIAGNOSIS — E119 Type 2 diabetes mellitus without complications: Secondary | ICD-10-CM | POA: Diagnosis not present

## 2015-10-22 DIAGNOSIS — F329 Major depressive disorder, single episode, unspecified: Secondary | ICD-10-CM | POA: Diagnosis not present

## 2015-10-24 DIAGNOSIS — F3340 Major depressive disorder, recurrent, in remission, unspecified: Secondary | ICD-10-CM | POA: Diagnosis not present

## 2015-10-24 DIAGNOSIS — I10 Essential (primary) hypertension: Secondary | ICD-10-CM | POA: Diagnosis not present

## 2015-10-24 DIAGNOSIS — Z6841 Body Mass Index (BMI) 40.0 and over, adult: Secondary | ICD-10-CM | POA: Diagnosis not present

## 2015-10-24 DIAGNOSIS — E785 Hyperlipidemia, unspecified: Secondary | ICD-10-CM | POA: Diagnosis not present

## 2015-10-24 DIAGNOSIS — Z794 Long term (current) use of insulin: Secondary | ICD-10-CM | POA: Diagnosis not present

## 2015-10-24 DIAGNOSIS — M545 Low back pain: Secondary | ICD-10-CM | POA: Diagnosis not present

## 2015-10-24 DIAGNOSIS — E119 Type 2 diabetes mellitus without complications: Secondary | ICD-10-CM | POA: Diagnosis not present

## 2015-11-02 ENCOUNTER — Ambulatory Visit: Payer: Medicare HMO | Admitting: Podiatry

## 2015-12-03 DIAGNOSIS — H04123 Dry eye syndrome of bilateral lacrimal glands: Secondary | ICD-10-CM | POA: Diagnosis not present

## 2015-12-16 DIAGNOSIS — R6889 Other general symptoms and signs: Secondary | ICD-10-CM | POA: Diagnosis not present

## 2016-01-13 ENCOUNTER — Other Ambulatory Visit: Payer: Self-pay | Admitting: Family Medicine

## 2016-01-13 ENCOUNTER — Other Ambulatory Visit: Payer: Self-pay | Admitting: Internal Medicine

## 2016-01-13 DIAGNOSIS — Z1231 Encounter for screening mammogram for malignant neoplasm of breast: Secondary | ICD-10-CM

## 2016-01-25 ENCOUNTER — Other Ambulatory Visit (HOSPITAL_COMMUNITY)
Admission: RE | Admit: 2016-01-25 | Discharge: 2016-01-25 | Disposition: A | Discharge status: Home / Self Care | Payer: Commercial Managed Care - HMO | Source: Ambulatory Visit | Attending: Family Medicine | Admitting: Family Medicine

## 2016-01-25 ENCOUNTER — Other Ambulatory Visit: Payer: Self-pay | Admitting: Family Medicine

## 2016-01-25 DIAGNOSIS — Z720 Tobacco use: Secondary | ICD-10-CM | POA: Diagnosis not present

## 2016-01-25 DIAGNOSIS — Z01411 Encounter for gynecological examination (general) (routine) with abnormal findings: Secondary | ICD-10-CM | POA: Insufficient documentation

## 2016-01-25 DIAGNOSIS — Z1151 Encounter for screening for human papillomavirus (HPV): Secondary | ICD-10-CM | POA: Diagnosis not present

## 2016-01-25 DIAGNOSIS — E785 Hyperlipidemia, unspecified: Secondary | ICD-10-CM | POA: Diagnosis not present

## 2016-01-25 DIAGNOSIS — Z124 Encounter for screening for malignant neoplasm of cervix: Secondary | ICD-10-CM | POA: Diagnosis not present

## 2016-01-25 DIAGNOSIS — E119 Type 2 diabetes mellitus without complications: Secondary | ICD-10-CM | POA: Diagnosis not present

## 2016-01-25 DIAGNOSIS — I10 Essential (primary) hypertension: Secondary | ICD-10-CM | POA: Diagnosis not present

## 2016-01-25 DIAGNOSIS — Z0000 Encounter for general adult medical examination without abnormal findings: Secondary | ICD-10-CM | POA: Diagnosis not present

## 2016-01-25 DIAGNOSIS — N760 Acute vaginitis: Secondary | ICD-10-CM | POA: Diagnosis not present

## 2016-01-25 DIAGNOSIS — Z7984 Long term (current) use of oral hypoglycemic drugs: Secondary | ICD-10-CM | POA: Diagnosis not present

## 2016-01-26 LAB — CYTOLOGY - PAP

## 2016-01-28 ENCOUNTER — Ambulatory Visit
Admission: RE | Admit: 2016-01-28 | Discharge: 2016-01-28 | Disposition: A | Discharge status: Home / Self Care | Payer: Medicare HMO | Source: Ambulatory Visit | Attending: Family Medicine | Admitting: Family Medicine

## 2016-01-28 DIAGNOSIS — Z1231 Encounter for screening mammogram for malignant neoplasm of breast: Secondary | ICD-10-CM

## 2016-04-06 ENCOUNTER — Telehealth: Payer: Self-pay | Admitting: Acute Care

## 2016-04-06 NOTE — Telephone Encounter (Signed)
Referral was canceled on 02/24/16 for the lung cancer screening program due to patient refusing to participate in the program. I sent notification to Dr. Vincente LibertyMorrow's office via fax on 02/24/16. Form was sent down to be scanned. Received back on 03/31/16 stating that it could not be scanned.

## 2016-05-19 DIAGNOSIS — E785 Hyperlipidemia, unspecified: Secondary | ICD-10-CM | POA: Diagnosis not present

## 2016-05-19 DIAGNOSIS — I10 Essential (primary) hypertension: Secondary | ICD-10-CM | POA: Diagnosis not present

## 2016-05-19 DIAGNOSIS — Z794 Long term (current) use of insulin: Secondary | ICD-10-CM | POA: Diagnosis not present

## 2016-05-19 DIAGNOSIS — Z720 Tobacco use: Secondary | ICD-10-CM | POA: Diagnosis not present

## 2016-05-19 DIAGNOSIS — R6889 Other general symptoms and signs: Secondary | ICD-10-CM | POA: Diagnosis not present

## 2016-05-19 DIAGNOSIS — Z23 Encounter for immunization: Secondary | ICD-10-CM | POA: Diagnosis not present

## 2016-05-19 DIAGNOSIS — E119 Type 2 diabetes mellitus without complications: Secondary | ICD-10-CM | POA: Diagnosis not present

## 2016-07-14 DIAGNOSIS — L609 Nail disorder, unspecified: Secondary | ICD-10-CM | POA: Diagnosis not present

## 2016-07-14 DIAGNOSIS — M549 Dorsalgia, unspecified: Secondary | ICD-10-CM | POA: Diagnosis not present

## 2016-07-14 DIAGNOSIS — R6889 Other general symptoms and signs: Secondary | ICD-10-CM | POA: Diagnosis not present

## 2016-09-19 DIAGNOSIS — I10 Essential (primary) hypertension: Secondary | ICD-10-CM | POA: Diagnosis not present

## 2016-09-19 DIAGNOSIS — R6889 Other general symptoms and signs: Secondary | ICD-10-CM | POA: Diagnosis not present

## 2016-09-19 DIAGNOSIS — E785 Hyperlipidemia, unspecified: Secondary | ICD-10-CM | POA: Diagnosis not present

## 2016-09-19 DIAGNOSIS — Z720 Tobacco use: Secondary | ICD-10-CM | POA: Diagnosis not present

## 2016-09-19 DIAGNOSIS — Z794 Long term (current) use of insulin: Secondary | ICD-10-CM | POA: Diagnosis not present

## 2016-09-19 DIAGNOSIS — M549 Dorsalgia, unspecified: Secondary | ICD-10-CM | POA: Diagnosis not present

## 2016-09-19 DIAGNOSIS — E119 Type 2 diabetes mellitus without complications: Secondary | ICD-10-CM | POA: Diagnosis not present

## 2016-12-19 DIAGNOSIS — H524 Presbyopia: Secondary | ICD-10-CM | POA: Diagnosis not present

## 2016-12-19 DIAGNOSIS — H2513 Age-related nuclear cataract, bilateral: Secondary | ICD-10-CM | POA: Diagnosis not present

## 2016-12-19 DIAGNOSIS — H5213 Myopia, bilateral: Secondary | ICD-10-CM | POA: Diagnosis not present

## 2016-12-19 DIAGNOSIS — H25013 Cortical age-related cataract, bilateral: Secondary | ICD-10-CM | POA: Diagnosis not present

## 2017-02-02 DIAGNOSIS — R6889 Other general symptoms and signs: Secondary | ICD-10-CM | POA: Diagnosis not present

## 2017-02-02 DIAGNOSIS — R252 Cramp and spasm: Secondary | ICD-10-CM | POA: Diagnosis not present

## 2017-02-02 DIAGNOSIS — D72819 Decreased white blood cell count, unspecified: Secondary | ICD-10-CM | POA: Diagnosis not present

## 2017-02-02 DIAGNOSIS — I10 Essential (primary) hypertension: Secondary | ICD-10-CM | POA: Diagnosis not present

## 2017-02-02 DIAGNOSIS — Z720 Tobacco use: Secondary | ICD-10-CM | POA: Diagnosis not present

## 2017-02-02 DIAGNOSIS — E785 Hyperlipidemia, unspecified: Secondary | ICD-10-CM | POA: Diagnosis not present

## 2017-02-02 DIAGNOSIS — Z0000 Encounter for general adult medical examination without abnormal findings: Secondary | ICD-10-CM | POA: Diagnosis not present

## 2017-02-02 DIAGNOSIS — E119 Type 2 diabetes mellitus without complications: Secondary | ICD-10-CM | POA: Diagnosis not present

## 2017-03-01 ENCOUNTER — Encounter: Payer: Self-pay | Admitting: Physical Therapy

## 2017-03-01 ENCOUNTER — Ambulatory Visit: Payer: Medicare HMO | Attending: Family Medicine | Admitting: Physical Therapy

## 2017-03-01 DIAGNOSIS — M6281 Muscle weakness (generalized): Secondary | ICD-10-CM | POA: Insufficient documentation

## 2017-03-01 DIAGNOSIS — G8929 Other chronic pain: Secondary | ICD-10-CM | POA: Insufficient documentation

## 2017-03-01 DIAGNOSIS — R262 Difficulty in walking, not elsewhere classified: Secondary | ICD-10-CM | POA: Diagnosis not present

## 2017-03-01 DIAGNOSIS — M545 Low back pain: Secondary | ICD-10-CM | POA: Insufficient documentation

## 2017-03-01 DIAGNOSIS — R6889 Other general symptoms and signs: Secondary | ICD-10-CM | POA: Diagnosis not present

## 2017-03-01 NOTE — Patient Instructions (Signed)
  Bring knee toward your chest Hold 20 seconds Repeat 3-5  times on each Leg Perform 2 times a day    Lye on your back Tighten up your abdominals Lift bottom off the floor Hold 5 seconds Repeat 10-15 times Perform 2 times a day    Lye on your back  Wrap a towel or sheet around your foot and lift leg up keeping your knee straight Hold 30 seconds Repeat 3-5 times on each leg Perform 2 times a day

## 2017-03-01 NOTE — Therapy (Signed)
[facility] [address] [place], [place], [address] Phone: [phone]   Fax:  [phone]  Physical Therapy Evaluation  Patient Details  Name: Jeanne [name] MRN: [id number] Date of Birth: 02/20/1956 Referring Provider: Farris HasAaron Morrow, MD  Encounter Date: 03/01/2017      PT End of Session - 03/01/17 1043    Visit Number 1   Number of Visits 16   Date for PT Re-Evaluation 05/02/17   Authorization Type [employer], Medicare, G-codes every 10 visits, Kx modifier at visit 15   PT Start Time 0932   PT Stop Time 1024   PT Time Calculation (min) 52 min   Activity Tolerance Patient tolerated treatment well   Behavior During Therapy [facility] for tasks assessed/performed      Past Medical History:  Diagnosis Date  . Abnormal Pap smear   . Arthritis   . Borderline diabetic   . Hyperlipidemia   . Hypertension     Past Surgical History:  Procedure Laterality Date  . CESAREAN SECTION      There were no vitals filed for this visit.       Subjective Assessment - 03/01/17 0944    Subjective Pt reporting no pain at rest, but pain increasing with standing and transitions to 8-9/10 in her low back. Pt reporting debility where she has difficulty with walking and requires assistance with ADL's and home activiites. Pt has to do household chores sitting. Pt reporting weight gain over the last 2 years due to inability to exercise without pain.    Limitations Walking;Standing;Lifting;House hold activities   How long can you sit comfortably? unlimited   How long can you stand comfortably? 3 minutes   How long can you walk comfortably? 5 minutes   Diagnostic tests X-ray   Patient Stated Goals Walk without pain   Currently in Pain? Yes   Pain Score 8   no pain sitting, 8-9/10    Pain Location Back   Pain Orientation Lower   Pain Descriptors / Indicators Aching   Pain Type Chronic pain   Pain Onset More than a month ago   Pain Frequency  Constant   Aggravating Factors  walking, standing, household chores   Pain Relieving Factors sitting, pain meds   Effect of Pain on Daily Activities inability to walk as you would like and difficulty with household chores and ADL's   Multiple Pain Sites No            [facility] PT Assessment - 03/01/17 0001      Assessment   Medical Diagnosis low back pain   Referring Provider Farris HasAaron Morrow, MD   Hand Dominance Right   Next MD Visit September 2018   Prior Therapy no     Precautions   Precautions None     Restrictions   Weight Bearing Restrictions No     Balance Screen   Has the patient fallen in the past 6 months No     Home Environment   Living Environment Private residence   Living Arrangements Other relatives   Available Help at Discharge Family   Type of Home House   Home Access Stairs to enter   Entrance Stairs-Number of Steps 2   Entrance Stairs-Rails Can reach both   Home Layout One level   Home Equipment Walker - 4 wheels;Cane - single point;Shower seat - built in;Grab bars - tub/shower;Other (comment)     Prior Function   Level of Independence Independent with basic ADLs  Vocation On disability   Leisure watch tv, go to church     Cognition   Overall Cognitive Status Within Functional Limits for tasks assessed     Observation/Other Assessments   Focus on Therapeutic Outcomes (FOTO)  56% limitation     Posture/Postural Control   Posture/Postural Control Postural limitations   Postural Limitations Rounded Shoulders;Forward head;Increased lumbar lordosis     ROM / Strength   AROM / PROM / Strength AROM;Strength     AROM   AROM Assessment Site Hip   Right/Left Hip --     Strength   Overall Strength Comments bilateral knee flexion and extension was 5/5   Strength Assessment Site Hip   Right/Left Hip Right;Left   Right Hip Flexion 4/5   Right Hip Extension 4/5   Right Hip ABduction 4-/5   Right Hip ADduction 4-/5   Left Hip Flexion 4-/5   Left Hip  Extension 4-/5   Left Hip ABduction 4-/5   Left Hip ADduction 4-/5     Palpation   Palpation comment Tenderness noted to bilateral lumbar paraspinals      Transfers   Transfers Sit to Stand   Sit to Stand 6: Modified independent (Device/Increase time)   Comments Pt requires increased time for walking and transfers due to back pain, Worse pain with back exetension     Ambulation/Gait   Ambulation/Gait Yes   Ambulation/Gait Assistance 6: Modified independent (Device/Increase time)   Assistive device Straight cane   Gait Pattern Decreased step length - right;Decreased step length - left;Trunk flexed;Wide base of support            Objective measurements completed on examination: See above findings.                  PT Education - 03/01/17 0950    Education provided Yes   Education Details HEP   Person(s) Educated Patient   Methods Explanation;Demonstration;Verbal cues   Comprehension Verbalized understanding;Returned demonstration          PT Short Term Goals - 03/01/17 1052      PT SHORT TERM GOAL #1   Title Pt will be independent with her HEP.    Time 4   Period Weeks   Status New     PT SHORT TERM GOAL #2   Title Pt will be able to tolerate 30 minutes of ther exercises.   Time 4   Period Weeks   Status New           PT Long Term Goals - 03/01/17 1053      PT LONG TERM GOAL #1   Title Pt will be able to improve her FOTO score from 56% limitation to </=40% limitation.   Time 8   Period Weeks   Status New     PT LONG TERM GOAL #2   Title Pt will improve her bilateral hip strength to 5/5 in order to improve walking and transfers.   Time 8   Period Weeks   Status New     PT LONG TERM GOAL #3   Title Pt will be able to perform sit to stand with no pain.    Time 8   Period Weeks   Status New     PT LONG TERM GOAL #4   Title Pt will be able to perform ADL's with pain less than / = 3/10.    Time 8   Period Weeks   Status New  Plan - 2017/03/05 1044    Clinical Impression Statement Pt arriving to therapy as a low complexity evalaution with dx of DDD and complains of LBP which has worsened over the last several months. Pt reporting pain has been going on for several years but it now limiting her ADL's and household chores. Pt with weakness noted in bilateral hips, pt amb with a straight cane and reporting increased pain of 8-9/10 with standing and walking. Pt with limited sidebending and trunk rotation due to pain. Pt was issued HEP and tolerated well. After performing the stretches, pt reporting some relief. Skilled PT needed to address pt's impairments with the below interventions.    Clinical Presentation Stable   Clinical Decision Making Low   Rehab Potential Good   PT Frequency 2x / week   PT Duration 8 weeks   PT Treatment/Interventions ADLs/Self Care Home Management;Moist Heat;Electrical Stimulation;Iontophoresis 4mg /ml Dexamethasone;Cryotherapy;Gait training;Stair training;Functional mobility training;Therapeutic activities;Therapeutic exercise;Balance training;Neuromuscular re-education;Patient/family education;Passive range of motion;Manual techniques;Dry needling;Taping;Traction;Ultrasound   PT Next Visit Plan Review HEP, lumbar stretching, LE strengthening, modalities as needed, begin Nustep   PT Home Exercise Plan hamstring stretch, bridge, single knee to chest   Consulted and Agree with Plan of Care Patient      Patient will benefit from skilled therapeutic intervention in order to improve the following deficits and impairments:  Postural dysfunction, Decreased mobility, Decreased strength, Improper body mechanics, Pain, Decreased activity tolerance, Obesity, Decreased range of motion, Difficulty walking  Visit Diagnosis: Chronic bilateral low back pain without sciatica  Difficulty in walking, not elsewhere classified  Muscle weakness (generalized)      G-Codes - 03/05/2017 1024     Functional Assessment Tool Used (Outpatient Only) FOTO, clinical assessment   Functional Limitation Mobility: Walking and moving around   Mobility: Walking and Moving Around Current Status (Z6109) At least 40 percent but less than 60 percent impaired, limited or restricted   Mobility: Walking and Moving Around Goal Status [phone]) At least 20 percent but less than 40 percent impaired, limited or restricted       Problem List There are no active problems to display for this patient.   Sharmon Leyden , MPT 2017-03-05, 11:02 AM  [facility] [address] [place], [place], [address] Phone: [phone]   Fax:  [phone]  Name: Jeanne [name] MRN: [id number] Date of Birth: 07-19-1956

## 2017-03-06 ENCOUNTER — Encounter: Payer: Self-pay | Admitting: Physical Therapy

## 2017-03-06 ENCOUNTER — Ambulatory Visit: Payer: Medicare HMO | Admitting: Physical Therapy

## 2017-03-06 DIAGNOSIS — R262 Difficulty in walking, not elsewhere classified: Secondary | ICD-10-CM | POA: Diagnosis not present

## 2017-03-06 DIAGNOSIS — M6281 Muscle weakness (generalized): Secondary | ICD-10-CM

## 2017-03-06 DIAGNOSIS — M545 Low back pain, unspecified: Secondary | ICD-10-CM

## 2017-03-06 DIAGNOSIS — G8929 Other chronic pain: Secondary | ICD-10-CM

## 2017-03-06 NOTE — Therapy (Addendum)
[place], [place], [address] Phone: [phone]   Fax:  [phone]  Physical Therapy Treatment  Patient Details  Name: Jeanne [name] MRN: [id number] Date of Birth: December 03, 1955 Referring Provider: London Pepper, MD  Encounter Date: 03/06/2017      PT End of Session - 03/06/17 1121    Visit Number 2   Number of Visits 16   Date for PT Re-Evaluation 05/02/17   Authorization Type [employer], Medicare, G-codes every 10 visits, Kx modifier at visit 15   PT Start Time 1105   PT Stop Time 1145   PT Time Calculation (min) 40 min   Activity Tolerance Patient tolerated treatment well   Behavior During Therapy [facility] for tasks assessed/performed      Past Medical History:  Diagnosis Date  . Abnormal Pap smear   . Arthritis   . Borderline diabetic   . Hyperlipidemia   . Hypertension     Past Surgical History:  Procedure Laterality Date  . CESAREAN SECTION      There were no vitals filed for this visit.      Subjective Assessment - 03/06/17 1111    Subjective Pt arriving to therapy today reporting soreness in left hamstring from the stretching, Pt was edu in muscle soreness was to be expected following stretching and exercise. Pt also reporting 4/10 pain in low back and reports pain has been more on the left.    Limitations Walking;Standing;Lifting;House hold activities   How long can you sit comfortably? unlimited   How long can you stand comfortably? 3 minutes   How long can you walk comfortably? 5 minutes   Diagnostic tests X-ray   Patient Stated Goals Walk without pain   Currently in Pain? Yes   Pain Score 4    Pain Orientation Lower;Left   Pain Descriptors / Indicators Aching;Sore;Tightness   Pain Type Chronic pain   Pain Onset More than a month ago   Pain Frequency Constant   Aggravating Factors  walking, standing, household chores   Pain Relieving Factors sitting, pain meds   Effect of Pain on  Daily Activities inability to walk as I would like and difficutly with some ADL's and household activities            [facility] PT Assessment - 03/06/17 0001      Assessment   Medical Diagnosis low back pain   Hand Dominance Right   Next MD Visit September 2018   Prior Therapy no     Precautions   Precautions None     Restrictions   Weight Bearing Restrictions No     Balance Screen   Has the patient fallen in the past 6 months No                     [facility] Adult PT Treatment/Exercise - 03/06/17 0001      Exercises   Exercises Lumbar     Lumbar Exercises: Stretches   Active Hamstring Stretch 3 reps;30 seconds   Active Hamstring Stretch Limitations each LE   Single Knee to Chest Stretch 3 reps;20 seconds   Pelvic Tilt 5 reps;10 seconds   Piriformis Stretch 3 reps;20 seconds     Lumbar Exercises: Aerobic   Stationary Bike Nustep:  level 4, 463 steps in 8 minutes     Lumbar Exercises: Seated   Long Arc Quad on Chair 1 set;10 reps   Hip Flexion on Ball Strengthening;10 reps;Limitations  Lumbar Exercises: Supine   Clam 20 reps;3 seconds;Limitations   Clam Limitations green theraband   Straight Leg Raise 10 reps;3 seconds   Straight Leg Raises Limitations 2 sets   Other Supine Lumbar Exercises SAQ with 3 # weight  10 reps x 2 sets                PT Education - 03/06/17 1114    Education provided Yes   Education Details reviewed HEP   Person(s) Educated Patient   Methods Explanation;Demonstration   Comprehension Verbalized understanding;Returned demonstration          PT Short Term Goals - 03/06/17 1129      PT SHORT TERM GOAL #1   Title Pt will be independent with her HEP.    Time 4   Period Weeks   Status On-going     PT SHORT TERM GOAL #2   Title Pt will be able to tolerate 30 minutes of ther exercises.   Time 4   Period Weeks   Status New           PT Long Term Goals - 03/01/17 1053      PT LONG TERM GOAL #1   Title  Pt will be able to improve her FOTO score from 56% limitation to </=40% limitation.   Time 8   Period Weeks   Status New     PT LONG TERM GOAL #2   Title Pt will improve her bilateral hip strength to 5/5 in order to improve walking and transfers.   Time 8   Period Weeks   Status New     PT LONG TERM GOAL #3   Title Pt will be able to perform sit to stand with no pain.    Time 8   Period Weeks   Status New     PT LONG TERM GOAL #4   Title Pt will be able to perform ADL's with pain less than / = 3/10.    Time 8   Period Weeks   Status New               Plan - 03/06/17 1125    Clinical Impression Statement Pt arriving to therapy today reporting 4/10 low back pain with is worse on the left. Pt reporting working on her exercises at home and arriving with hamstring soreness on the left side. Pt tolerated Nustep today at level 4 completing 463 steps in 8 minutes. Pt toletaing mat exercises well. Discussed dropping PT frequency to 1x/week due to pt request secondary to pt not being able to afford her co-pay each visit. Continue with skilled PT as pt progresses toward LTG's set.    Rehab Potential Good   PT Frequency 2x / week  had to drop to 1x/week due to financial restriants   PT Duration 8 weeks   PT Treatment/Interventions ADLs/Self Care Home Management;Moist Heat;Electrical Stimulation;Iontophoresis 7m/ml Dexamethasone;Cryotherapy;Gait training;Stair training;Functional mobility training;Therapeutic activities;Therapeutic exercise;Balance training;Neuromuscular re-education;Patient/family education;Passive range of motion;Manual techniques;Dry needling;Taping;Traction;Ultrasound   PT Next Visit Plan Review HEP, lumbar stretching, LE strengthening, modalities as needed, begin Nustep   PT Home Exercise Plan hamstring stretch, bridge, single knee to chest   Consulted and Agree with Plan of Care Patient      Patient will benefit from skilled therapeutic intervention in order to  improve the following deficits and impairments:  Postural dysfunction, Decreased mobility, Decreased strength, Improper body mechanics, Pain, Decreased activity tolerance, Obesity, Decreased range of motion, Difficulty walking  Visit  Diagnosis: Chronic bilateral low back pain without sciatica  Difficulty in walking, not elsewhere classified  Muscle weakness (generalized)     Problem List There are no active problems to display for this patient.   Oretha Caprice, MPT 03/06/2017, 1:02 PM  [facility] [address] [place], [place], [address] Phone: [phone]   Fax:  [phone]  Name: Jeanne [name] MRN: [id number] Date of Birth: 23-Apr-1956  PHYSICAL THERAPY DISCHARGE SUMMARY  Visits from Start of Care: 2  Current functional level related to goals / functional outcomes: See above   Remaining deficits: See above   Education / Equipment: HEP  Plan: Patient agrees to discharge.  Patient goals were not met. Patient is being discharged due to financial reasons.  ?????   Romualdo Bolk, PT, DPT 04/17/17 8:54 AM Phone: [phone] Fax: [phone]

## 2017-03-06 NOTE — Patient Instructions (Signed)
  Seated Marching:  Lift knee up toward ceiling Hold 3 seconds Repeat 20 times on each leg   Pirformis Stretch:  Hold 30 seconds Repeat 3 times each leg (printed on another handout)

## 2017-03-13 ENCOUNTER — Encounter: Payer: Medicare HMO | Admitting: Physical Therapy

## 2017-03-15 ENCOUNTER — Encounter: Payer: Medicare HMO | Admitting: Physical Therapy

## 2017-03-20 ENCOUNTER — Encounter: Payer: Medicare HMO | Admitting: Physical Therapy

## 2017-03-22 ENCOUNTER — Encounter: Payer: Medicare HMO | Admitting: Physical Therapy

## 2017-03-27 ENCOUNTER — Encounter: Payer: Medicare HMO | Admitting: Physical Therapy

## 2017-03-29 ENCOUNTER — Encounter: Payer: Medicare HMO | Admitting: Physical Therapy

## 2017-04-20 ENCOUNTER — Other Ambulatory Visit: Payer: Self-pay | Admitting: Family Medicine

## 2017-04-20 DIAGNOSIS — Z1231 Encounter for screening mammogram for malignant neoplasm of breast: Secondary | ICD-10-CM

## 2017-05-07 DIAGNOSIS — Z23 Encounter for immunization: Secondary | ICD-10-CM | POA: Diagnosis not present

## 2017-05-07 DIAGNOSIS — Z720 Tobacco use: Secondary | ICD-10-CM | POA: Diagnosis not present

## 2017-05-07 DIAGNOSIS — I10 Essential (primary) hypertension: Secondary | ICD-10-CM | POA: Diagnosis not present

## 2017-05-07 DIAGNOSIS — E119 Type 2 diabetes mellitus without complications: Secondary | ICD-10-CM | POA: Diagnosis not present

## 2017-05-07 DIAGNOSIS — R6889 Other general symptoms and signs: Secondary | ICD-10-CM | POA: Diagnosis not present

## 2017-05-07 DIAGNOSIS — E785 Hyperlipidemia, unspecified: Secondary | ICD-10-CM | POA: Diagnosis not present

## 2017-05-07 DIAGNOSIS — L309 Dermatitis, unspecified: Secondary | ICD-10-CM | POA: Diagnosis not present

## 2017-05-08 ENCOUNTER — Ambulatory Visit
Admission: RE | Admit: 2017-05-08 | Discharge: 2017-05-08 | Disposition: A | Discharge status: Home / Self Care | Payer: Medicare HMO | Source: Ambulatory Visit | Attending: Family Medicine | Admitting: Family Medicine

## 2017-05-08 DIAGNOSIS — Z1231 Encounter for screening mammogram for malignant neoplasm of breast: Secondary | ICD-10-CM

## 2017-07-02 ENCOUNTER — Encounter (HOSPITAL_COMMUNITY): Payer: Self-pay

## 2017-09-03 DIAGNOSIS — I10 Essential (primary) hypertension: Secondary | ICD-10-CM | POA: Diagnosis not present

## 2017-09-03 DIAGNOSIS — E785 Hyperlipidemia, unspecified: Secondary | ICD-10-CM | POA: Diagnosis not present

## 2017-09-03 DIAGNOSIS — Z720 Tobacco use: Secondary | ICD-10-CM | POA: Diagnosis not present

## 2017-09-03 DIAGNOSIS — E119 Type 2 diabetes mellitus without complications: Secondary | ICD-10-CM | POA: Diagnosis not present

## 2017-09-03 DIAGNOSIS — N390 Urinary tract infection, site not specified: Secondary | ICD-10-CM | POA: Diagnosis not present

## 2017-09-24 DIAGNOSIS — R3129 Other microscopic hematuria: Secondary | ICD-10-CM | POA: Diagnosis not present

## 2017-12-04 DIAGNOSIS — E119 Type 2 diabetes mellitus without complications: Secondary | ICD-10-CM | POA: Diagnosis not present

## 2017-12-04 DIAGNOSIS — Z23 Encounter for immunization: Secondary | ICD-10-CM | POA: Diagnosis not present

## 2017-12-04 DIAGNOSIS — L609 Nail disorder, unspecified: Secondary | ICD-10-CM | POA: Diagnosis not present

## 2017-12-04 DIAGNOSIS — R209 Unspecified disturbances of skin sensation: Secondary | ICD-10-CM | POA: Diagnosis not present

## 2017-12-04 DIAGNOSIS — N644 Mastodynia: Secondary | ICD-10-CM | POA: Diagnosis not present

## 2017-12-04 DIAGNOSIS — I10 Essential (primary) hypertension: Secondary | ICD-10-CM | POA: Diagnosis not present

## 2017-12-06 ENCOUNTER — Other Ambulatory Visit: Payer: Self-pay | Admitting: Family Medicine

## 2017-12-06 DIAGNOSIS — N644 Mastodynia: Secondary | ICD-10-CM

## 2018-04-10 DIAGNOSIS — H52203 Unspecified astigmatism, bilateral: Secondary | ICD-10-CM | POA: Diagnosis not present

## 2018-04-10 DIAGNOSIS — H524 Presbyopia: Secondary | ICD-10-CM | POA: Diagnosis not present

## 2018-04-10 DIAGNOSIS — E119 Type 2 diabetes mellitus without complications: Secondary | ICD-10-CM | POA: Diagnosis not present

## 2018-04-10 DIAGNOSIS — H5213 Myopia, bilateral: Secondary | ICD-10-CM | POA: Diagnosis not present

## 2018-05-08 DIAGNOSIS — Z23 Encounter for immunization: Secondary | ICD-10-CM | POA: Diagnosis not present

## 2018-05-08 DIAGNOSIS — E785 Hyperlipidemia, unspecified: Secondary | ICD-10-CM | POA: Diagnosis not present

## 2018-05-08 DIAGNOSIS — E1169 Type 2 diabetes mellitus with other specified complication: Secondary | ICD-10-CM | POA: Diagnosis not present

## 2018-05-08 DIAGNOSIS — Z0000 Encounter for general adult medical examination without abnormal findings: Secondary | ICD-10-CM | POA: Diagnosis not present

## 2018-05-08 DIAGNOSIS — I10 Essential (primary) hypertension: Secondary | ICD-10-CM | POA: Diagnosis not present

## 2018-05-08 DIAGNOSIS — Z113 Encounter for screening for infections with a predominantly sexual mode of transmission: Secondary | ICD-10-CM | POA: Diagnosis not present

## 2018-05-08 DIAGNOSIS — Z1159 Encounter for screening for other viral diseases: Secondary | ICD-10-CM | POA: Diagnosis not present

## 2018-05-08 DIAGNOSIS — Z720 Tobacco use: Secondary | ICD-10-CM | POA: Diagnosis not present

## 2018-06-10 ENCOUNTER — Other Ambulatory Visit: Payer: Self-pay | Admitting: Family Medicine

## 2018-06-10 DIAGNOSIS — Z1231 Encounter for screening mammogram for malignant neoplasm of breast: Secondary | ICD-10-CM

## 2018-07-23 ENCOUNTER — Ambulatory Visit
Admission: RE | Admit: 2018-07-23 | Discharge: 2018-07-23 | Disposition: A | Discharge status: Home / Self Care | Payer: Medicare HMO | Source: Ambulatory Visit | Attending: Family Medicine | Admitting: Family Medicine

## 2018-07-23 DIAGNOSIS — Z1231 Encounter for screening mammogram for malignant neoplasm of breast: Secondary | ICD-10-CM | POA: Diagnosis not present

## 2018-11-20 DIAGNOSIS — E1169 Type 2 diabetes mellitus with other specified complication: Secondary | ICD-10-CM | POA: Diagnosis not present

## 2018-11-20 DIAGNOSIS — I10 Essential (primary) hypertension: Secondary | ICD-10-CM | POA: Diagnosis not present

## 2018-11-20 DIAGNOSIS — M1990 Unspecified osteoarthritis, unspecified site: Secondary | ICD-10-CM | POA: Diagnosis not present

## 2018-11-20 DIAGNOSIS — Z720 Tobacco use: Secondary | ICD-10-CM | POA: Diagnosis not present

## 2018-11-20 DIAGNOSIS — M549 Dorsalgia, unspecified: Secondary | ICD-10-CM | POA: Diagnosis not present

## 2018-11-20 DIAGNOSIS — E785 Hyperlipidemia, unspecified: Secondary | ICD-10-CM | POA: Diagnosis not present

## 2018-12-25 DIAGNOSIS — E1169 Type 2 diabetes mellitus with other specified complication: Secondary | ICD-10-CM | POA: Diagnosis not present

## 2018-12-25 DIAGNOSIS — Z794 Long term (current) use of insulin: Secondary | ICD-10-CM | POA: Diagnosis not present

## 2019-02-24 DIAGNOSIS — Z6841 Body Mass Index (BMI) 40.0 and over, adult: Secondary | ICD-10-CM | POA: Diagnosis not present

## 2019-02-24 DIAGNOSIS — E1169 Type 2 diabetes mellitus with other specified complication: Secondary | ICD-10-CM | POA: Diagnosis not present

## 2019-02-24 DIAGNOSIS — Z720 Tobacco use: Secondary | ICD-10-CM | POA: Diagnosis not present

## 2019-02-24 DIAGNOSIS — E785 Hyperlipidemia, unspecified: Secondary | ICD-10-CM | POA: Diagnosis not present

## 2019-02-24 DIAGNOSIS — Z794 Long term (current) use of insulin: Secondary | ICD-10-CM | POA: Diagnosis not present

## 2019-02-24 DIAGNOSIS — I10 Essential (primary) hypertension: Secondary | ICD-10-CM | POA: Diagnosis not present

## 2019-06-04 DIAGNOSIS — E785 Hyperlipidemia, unspecified: Secondary | ICD-10-CM | POA: Diagnosis not present

## 2019-06-04 DIAGNOSIS — I10 Essential (primary) hypertension: Secondary | ICD-10-CM | POA: Diagnosis not present

## 2019-06-04 DIAGNOSIS — J019 Acute sinusitis, unspecified: Secondary | ICD-10-CM | POA: Diagnosis not present

## 2019-06-04 DIAGNOSIS — Z720 Tobacco use: Secondary | ICD-10-CM | POA: Diagnosis not present

## 2019-06-04 DIAGNOSIS — Z0000 Encounter for general adult medical examination without abnormal findings: Secondary | ICD-10-CM | POA: Diagnosis not present

## 2019-06-04 DIAGNOSIS — E1169 Type 2 diabetes mellitus with other specified complication: Secondary | ICD-10-CM | POA: Diagnosis not present

## 2019-06-04 DIAGNOSIS — Z794 Long term (current) use of insulin: Secondary | ICD-10-CM | POA: Diagnosis not present

## 2019-06-05 ENCOUNTER — Other Ambulatory Visit: Payer: Self-pay | Admitting: Family Medicine

## 2019-06-05 DIAGNOSIS — Z1231 Encounter for screening mammogram for malignant neoplasm of breast: Secondary | ICD-10-CM

## 2019-06-16 DIAGNOSIS — E119 Type 2 diabetes mellitus without complications: Secondary | ICD-10-CM | POA: Diagnosis not present

## 2019-06-16 DIAGNOSIS — H5213 Myopia, bilateral: Secondary | ICD-10-CM | POA: Diagnosis not present

## 2019-06-27 DIAGNOSIS — Z0000 Encounter for general adult medical examination without abnormal findings: Secondary | ICD-10-CM | POA: Diagnosis not present

## 2019-06-27 DIAGNOSIS — E1169 Type 2 diabetes mellitus with other specified complication: Secondary | ICD-10-CM | POA: Diagnosis not present

## 2019-06-27 DIAGNOSIS — Z23 Encounter for immunization: Secondary | ICD-10-CM | POA: Diagnosis not present

## 2019-06-27 DIAGNOSIS — E785 Hyperlipidemia, unspecified: Secondary | ICD-10-CM | POA: Diagnosis not present

## 2019-07-01 DIAGNOSIS — H2513 Age-related nuclear cataract, bilateral: Secondary | ICD-10-CM | POA: Diagnosis not present

## 2019-07-03 NOTE — Progress Notes (Addendum)
[facility] - Clinic Note  07/08/2019     CHIEF COMPLAINT Patient presents for Retina Evaluation   HISTORY OF PRESENT ILLNESS: Jeanne [name] is a 63 y.o. female who presents to the clinic today for:   HPI    Retina Evaluation    In both eyes.  This started years ago.  Duration of years.  Context:  distance vision.  I, the attending physician,  performed the HPI with the patient and updated documentation appropriately.          Comments    BS: 114 this AM A1c: 6.6 (last week) Pt states her vision is stable OU and states her Rx has not changed in several years in her glasses.  Patient denies eye pain or discomfort and denies any new or worsening floaters or fol OU.       Last edited by Rennis ChrisZamora, Minerva Bluett, MD on 07/08/2019 12:53 PM. (History)    pt states Dr. Cathey EndowBowen sent her here bc he wanted her eyes checked out by a retina specialist before cataract sx, pt states she had an episode in May 2013 where she lost vision in one of her eyes (she states she doesn't remember which eye) she states all she could see was white, pt [facility], but they couldn't figure out what happened and sent her to see another specialist, she states she ended up at [facility] and was dx with iritis, pt states she has arthritis, pt states she has not had an iritis flare up in about 2 years, pt states she has seen Hollander and Tanner at [facility] also, pt states she has back problems and has been told she needs to have sx  Referring physician: Sinda DuBowen, [place], MD [address] [place] [id number]  HISTORICAL INFORMATION:   Selected notes from the MEDICAL RECORD NUMBER Referral from Dr. Cathey EndowBowen for cat sx clearance OU   CURRENT MEDICATIONS: Current Outpatient Medications (Ophthalmic Drugs)  Medication Sig  . prednisoLONE acetate (PRED FORTE) 1 % ophthalmic suspension Place 1 drop into the right eye once.  Marland Kitchen. tetrahydrozoline 0.05 % ophthalmic solution Place 1 drop into the right  eye 4 (four) times daily.   No current facility-administered medications for this visit.  (Ophthalmic Drugs)   Current Outpatient Medications (Other)  Medication Sig  . ACCU-CHEK AVIVA PLUS test strip   . Acetaminophen 500 MG coapsule   . B-D INS SYR ULTRAFINE 1CC/30G 30G X 1/2" 1 ML MISC   . clotrimazole-betamethasone (LOTRISONE) cream   . cyclobenzaprine (FLEXERIL) 10 MG tablet Take 10 mg by mouth at bedtime as needed. For sore muscles  . diclofenac (VOLTAREN) 25 MG EC tablet Take 25 mg by mouth 2 (two) times daily.  . DiphenhydrAMINE HCl (BENADRYL ALLERGY PO) Take by mouth.  Marland Kitchen. ibuprofen (ADVIL,MOTRIN) 200 MG tablet Take 600 mg by mouth every 8 (eight) hours as needed. For pain  . Incontinence Supply Disposable (BLADDER CONTROL PADS EX ABSORB) MISC   . lisinopril (PRINIVIL,ZESTRIL) 20 MG tablet Take 20 mg by mouth daily.  Marland Kitchen. lisinopril-hydrochlorothiazide (PRINZIDE,ZESTORETIC) 20-12.5 MG per tablet Take 1 tablet by mouth daily.  Marland Kitchen. Phenylephrine-Acetaminophen 5-325 MG TABS   . pravastatin (PRAVACHOL) 10 MG tablet   . Pseudoeph-Doxylamine-DM-APAP (NYQUIL PO) Take by mouth.  . Pseudoephedrine-APAP-DM (DAYQUIL PO) Take by mouth.  . sulfamethoxazole-trimethoprim (BACTRIM DS,SEPTRA DS) 800-160 MG tablet    No current facility-administered medications for this visit.  (Other)  REVIEW OF SYSTEMS:    ALLERGIES No Known Allergies  PAST MEDICAL HISTORY Past Medical History:  Diagnosis Date  . Abnormal Pap smear   . Arthritis   . Borderline diabetic   . Hyperlipidemia   . Hypertension    Past Surgical History:  Procedure Laterality Date  . CESAREAN SECTION      FAMILY HISTORY Family History  Problem Relation Age of Onset  . Hypertension Maternal Grandmother   . Alcohol abuse Maternal Grandmother   . Diabetes Mother   . Heart disease Mother   . Hypertension Mother   . Alcohol abuse Mother   . Heart disease Sister   . Alcohol abuse Brother   . Alcohol abuse Sister    . Hypertension Maternal Aunt   . Breast cancer Neg Hx     SOCIAL HISTORY Social History   Tobacco Use  . Smoking status: Current Every Day Smoker    Packs/day: 1.00    Years: 41.00    Pack years: 41.00    Types: Cigarettes  . Smokeless tobacco: Never Used  Substance Use Topics  . Alcohol use: No  . Drug use: No         OPHTHALMIC EXAM:  Base Eye Exam    Visual Acuity (Snellen - Linear)      Right Left   Dist cc 20/50 -1 20/60 +1   Dist ph cc NI 20/50   Correction: Glasses       Tonometry (Tonopen, 9:02 AM)      Right Left   Pressure 15 14       Pupils      Dark Light Shape React APD   Right 4 3 Round Brisk 0   Left 4 3 Round Brisk 0       Visual Fields      Left Right    Full Full       Extraocular Movement      Right Left    Full Full       Neuro/Psych    Oriented x3: Yes   Mood/Affect: Normal       Dilation    Both eyes: 1.0% Mydriacyl, 2.5% Phenylephrine @ 9:02 AM        Slit Lamp and Fundus Exam    Slit Lamp Exam      Right Left   Lids/Lashes Dermatochalasis - upper lid, mild Meibomian gland dysfunction Dermatochalasis - upper lid, mild Meibomian gland dysfunction   Conjunctiva/Sclera White and quiet, mild melanosis White and quiet, mild melanosis   Cornea Trace Punctate epithelial erosions Trace Punctate epithelial erosions   Anterior Chamber Deep and quiet, no cell or flare Deep and quiet, no cell or flare   Iris Round and dilated Round and dilated   Lens 2-3+ Nuclear sclerosis, 2-3+ Cortical cataract, positive pigment on anterior capsule 2-3+ Nuclear sclerosis, 2-3+ Cortical cataract, positive pigment on anterior capsule   Vitreous Vitreous syneresis, no cells, vitreous condensations Vitreous syneresis, no cells, vitreous condensations       Fundus Exam      Right Left   Disc Tilted, pink, sharp rim, 360 PPP/PPA Tilted, pink, sharp rim, 360 PPP/PPA   C/D Ratio 0.2 0.1   Macula Flat, Blunted foveal reflex, RPE mottling, clumping  and atrophy, No heme or edema Flat, Blunted foveal reflex, RPE mottling, clumping and atrophy, No heme or edema   Vessels Vascular attenuation, Tortuous Vascular attenuation, Tortuous   Periphery Attached, mild paving stone degeneration IT quad, peripheral cystoid degeneration,  No RT/RD Attached, mild paving stone degeneration IT quad, mild peripheral drusen nasally        Refraction    Wearing Rx      Sphere Cylinder Axis Add   Right -19.50 +2.00 097 +2.50   Left -16.50 +1.00 065 +2.50       Manifest Refraction      Sphere Cylinder Axis Dist VA   Right -19.00 +2.00 097 20/50-1   Left -16.00 +1.00 065 20/50          IMAGING AND PROCEDURES  Imaging and Procedures for @TODAY @  OCT, Retina - OU - Both Eyes       Right Eye Quality was good. Central Foveal Thickness: 298. Progression has no prior data. Findings include normal foveal contour, myopic contour, outer retinal atrophy, no IRF, no SRF.   Left Eye Quality was good. Central Foveal Thickness: 286. Progression has no prior data. Findings include normal foveal contour, no IRF, no SRF, myopic contour, outer retinal atrophy.   Notes *Images captured and stored on drive  Diagnosis / Impression:  NFP, no IRF/SRF OU Myopic degeneration OU  Clinical management:  See below  Abbreviations: NFP - Normal foveal profile. CME - cystoid macular edema. PED - pigment epithelial detachment. IRF - intraretinal fluid. SRF - subretinal fluid. EZ - ellipsoid zone. ERM - epiretinal membrane. ORA - outer retinal atrophy. ORT - outer retinal tubulation. SRHM - subretinal hyper-reflective material                 ASSESSMENT/PLAN:    ICD-10-CM   1. Severe myopia of both eyes  H52.13   2. Uncomplicated degenerative myopia of both eyes  H44.23   3. Retinal edema  H35.81 OCT, Retina - OU - Both Eyes  4. Essential hypertension  I10   5. Hypertensive retinopathy of both eyes  H35.033   6. Combined forms of age-related cataract of  both eyes  H25.813     1,2. Myopic Degeneration OU  - severe myopia with astigmatism OU   OD -19.00  +2.00  x097    OS -16.50  +1.00  x065  - OCT shows mild myopic degeneration / ORA  - on peripheral retinal exam -- mild pavingstone and peripheral cystoid degeneration, but no RT/RD OU  - discussed findings, prognosis  - no intervention indicated or recommended at this time  - clear from a retina standpoint to proceed with cataract surgery when pt and surgeon are ready  3. No retinal edema on exam or OCT  4,5. Hypertensive retinopathy OU - discussed importance of tight BP control - monitor  6. Mixed form age related cataract OU  - The symptoms of cataract, surgical options, and treatments and risks were discussed with patient.  - discussed diagnosis and progression  - under the expert management of Dr. Jacinto Reap. Bowen  - clear from a retina standpoint to proceed with cataract surgery when pt and surgeon are ready    Ophthalmic Meds Ordered this visit:  No orders of the defined types were placed in this encounter.      Return for f/u 4-6 months myopic degeneration OU, DFE, OCT.  There are no Patient Instructions on file for this visit.   Explained the diagnoses, plan, and follow up with the patient and they expressed understanding.  Patient expressed understanding of the importance of proper follow up care.   This document serves as a record of services personally performed by Gardiner Sleeper, MD, PhD. It was created on  their behalf by Cristopher Estimable, COT an ophthalmic technician. The creation of this record is the provider's dictation and/or activities during the visit.    Electronically signed by: Cristopher Estimable, COT 07/03/19 @ 1:15 PM  Karie Chimera, M.D., Ph.D. Diseases & Surgery of the Retina and Vitreous [facility] 07/08/2019   I have reviewed the above documentation for accuracy and completeness, and I agree with the above. Karie Chimera, M.D.,  Ph.D. 07/08/19 1:15 PM    Abbreviations: M myopia (nearsighted); A astigmatism; H hyperopia (farsighted); P presbyopia; Mrx spectacle prescription;  CTL contact lenses; OD right eye; OS left eye; OU both eyes  XT exotropia; ET esotropia; PEK punctate epithelial keratitis; PEE punctate epithelial erosions; DES dry eye syndrome; MGD meibomian gland dysfunction; ATs artificial tears; PFAT's preservative free artificial tears; NSC nuclear sclerotic cataract; PSC posterior subcapsular cataract; ERM epi-retinal membrane; PVD posterior vitreous detachment; RD retinal detachment; DM diabetes mellitus; DR diabetic retinopathy; NPDR non-proliferative diabetic retinopathy; PDR proliferative diabetic retinopathy; CSME clinically significant macular edema; DME diabetic macular edema; dbh dot blot hemorrhages; CWS cotton wool spot; POAG primary open angle glaucoma; C/D cup-to-disc ratio; HVF humphrey visual field; GVF goldmann visual field; OCT optical coherence tomography; IOP intraocular pressure; BRVO Branch retinal vein occlusion; CRVO central retinal vein occlusion; CRAO central retinal artery occlusion; BRAO branch retinal artery occlusion; RT retinal tear; SB scleral buckle; PPV pars plana vitrectomy; VH Vitreous hemorrhage; PRP panretinal laser photocoagulation; IVK intravitreal kenalog; VMT vitreomacular traction; MH Macular hole;  NVD neovascularization of the disc; NVE neovascularization elsewhere; AREDS age related eye disease study; ARMD age related macular degeneration; POAG primary open angle glaucoma; EBMD epithelial/anterior basement membrane dystrophy; ACIOL anterior chamber intraocular lens; IOL intraocular lens; PCIOL posterior chamber intraocular lens; Phaco/IOL phacoemulsification with intraocular lens placement; PRK photorefractive keratectomy; LASIK laser assisted in situ keratomileusis; HTN hypertension; DM diabetes mellitus; COPD chronic obstructive pulmonary disease

## 2019-07-08 ENCOUNTER — Encounter (INDEPENDENT_AMBULATORY_CARE_PROVIDER_SITE_OTHER): Payer: Self-pay | Admitting: Ophthalmology

## 2019-07-08 ENCOUNTER — Ambulatory Visit (INDEPENDENT_AMBULATORY_CARE_PROVIDER_SITE_OTHER): Payer: Medicare HMO | Admitting: Ophthalmology

## 2019-07-08 DIAGNOSIS — H3581 Retinal edema: Secondary | ICD-10-CM

## 2019-07-08 DIAGNOSIS — H5213 Myopia, bilateral: Secondary | ICD-10-CM | POA: Diagnosis not present

## 2019-07-08 DIAGNOSIS — H35033 Hypertensive retinopathy, bilateral: Secondary | ICD-10-CM

## 2019-07-08 DIAGNOSIS — H25813 Combined forms of age-related cataract, bilateral: Secondary | ICD-10-CM | POA: Diagnosis not present

## 2019-07-08 DIAGNOSIS — H4423 Degenerative myopia, bilateral: Secondary | ICD-10-CM

## 2019-07-08 DIAGNOSIS — I10 Essential (primary) hypertension: Secondary | ICD-10-CM

## 2019-07-28 ENCOUNTER — Other Ambulatory Visit: Payer: Self-pay

## 2019-07-28 ENCOUNTER — Ambulatory Visit
Admission: RE | Admit: 2019-07-28 | Discharge: 2019-07-28 | Disposition: A | Discharge status: Home / Self Care | Payer: Medicare HMO | Source: Ambulatory Visit | Attending: Family Medicine | Admitting: Family Medicine

## 2019-07-28 DIAGNOSIS — Z1231 Encounter for screening mammogram for malignant neoplasm of breast: Secondary | ICD-10-CM | POA: Diagnosis not present

## 2019-09-11 DIAGNOSIS — Z794 Long term (current) use of insulin: Secondary | ICD-10-CM | POA: Diagnosis not present

## 2019-09-11 DIAGNOSIS — E1169 Type 2 diabetes mellitus with other specified complication: Secondary | ICD-10-CM | POA: Diagnosis not present

## 2019-09-11 DIAGNOSIS — E785 Hyperlipidemia, unspecified: Secondary | ICD-10-CM | POA: Diagnosis not present

## 2019-09-11 DIAGNOSIS — I10 Essential (primary) hypertension: Secondary | ICD-10-CM | POA: Diagnosis not present

## 2019-09-11 DIAGNOSIS — Z720 Tobacco use: Secondary | ICD-10-CM | POA: Diagnosis not present

## 2019-09-15 HISTORY — PX: CATARACT EXTRACTION W/ INTRAOCULAR LENS IMPLANT: SHX1309

## 2019-09-15 HISTORY — PX: CATARACT EXTRACTION: SUR2

## 2019-09-17 DIAGNOSIS — H25812 Combined forms of age-related cataract, left eye: Secondary | ICD-10-CM | POA: Diagnosis not present

## 2019-09-17 DIAGNOSIS — H2511 Age-related nuclear cataract, right eye: Secondary | ICD-10-CM | POA: Diagnosis not present

## 2019-10-01 DIAGNOSIS — H25811 Combined forms of age-related cataract, right eye: Secondary | ICD-10-CM | POA: Diagnosis not present

## 2019-10-01 DIAGNOSIS — H25012 Cortical age-related cataract, left eye: Secondary | ICD-10-CM | POA: Diagnosis not present

## 2019-10-01 DIAGNOSIS — H2512 Age-related nuclear cataract, left eye: Secondary | ICD-10-CM | POA: Diagnosis not present

## 2019-11-02 DIAGNOSIS — H2000 Unspecified acute and subacute iridocyclitis: Secondary | ICD-10-CM | POA: Diagnosis not present

## 2019-11-06 DIAGNOSIS — E1169 Type 2 diabetes mellitus with other specified complication: Secondary | ICD-10-CM | POA: Diagnosis not present

## 2020-01-05 ENCOUNTER — Encounter (INDEPENDENT_AMBULATORY_CARE_PROVIDER_SITE_OTHER): Payer: Medicare HMO | Admitting: Ophthalmology

## 2020-01-06 DIAGNOSIS — I10 Essential (primary) hypertension: Secondary | ICD-10-CM | POA: Diagnosis not present

## 2020-01-06 DIAGNOSIS — E785 Hyperlipidemia, unspecified: Secondary | ICD-10-CM | POA: Diagnosis not present

## 2020-01-06 DIAGNOSIS — E1169 Type 2 diabetes mellitus with other specified complication: Secondary | ICD-10-CM | POA: Diagnosis not present

## 2020-01-06 DIAGNOSIS — Z720 Tobacco use: Secondary | ICD-10-CM | POA: Diagnosis not present

## 2020-02-20 DIAGNOSIS — E1169 Type 2 diabetes mellitus with other specified complication: Secondary | ICD-10-CM | POA: Diagnosis not present

## 2020-04-08 IMAGING — MG DIGITAL SCREENING BILAT W/ TOMO W/ CAD
8 of 16 series · 8 of 40 positions shown · non-contrast
Comparison: Previous exam(s).

CLINICAL DATA: Screening.

EXAM:
DIGITAL SCREENING BILATERAL MAMMOGRAM WITH TOMO AND CAD

[L CC synth-2D (1 of 2)]
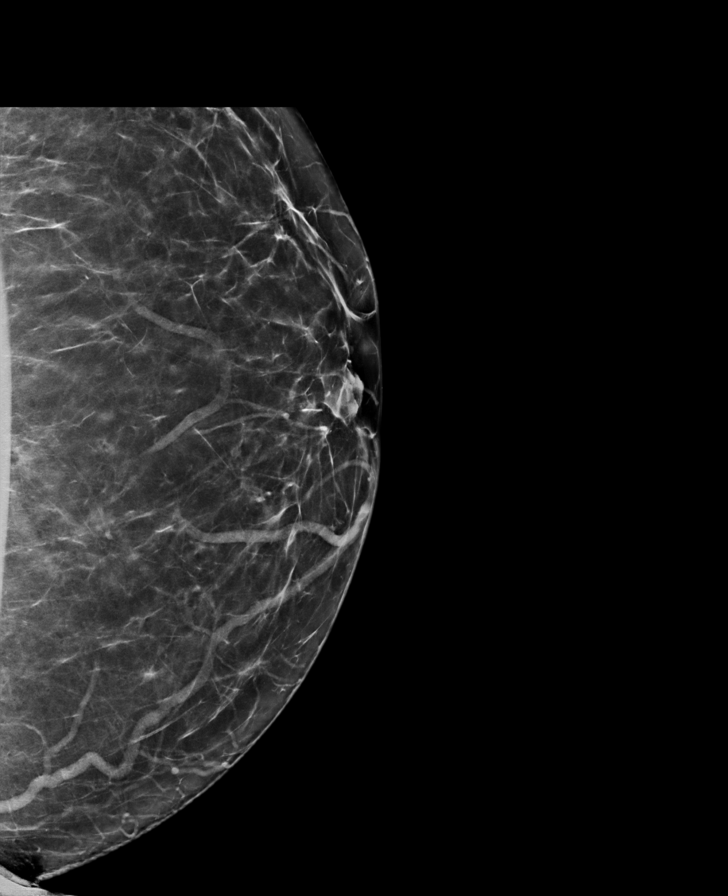

[L MLO synth-2D (1 of 2)]
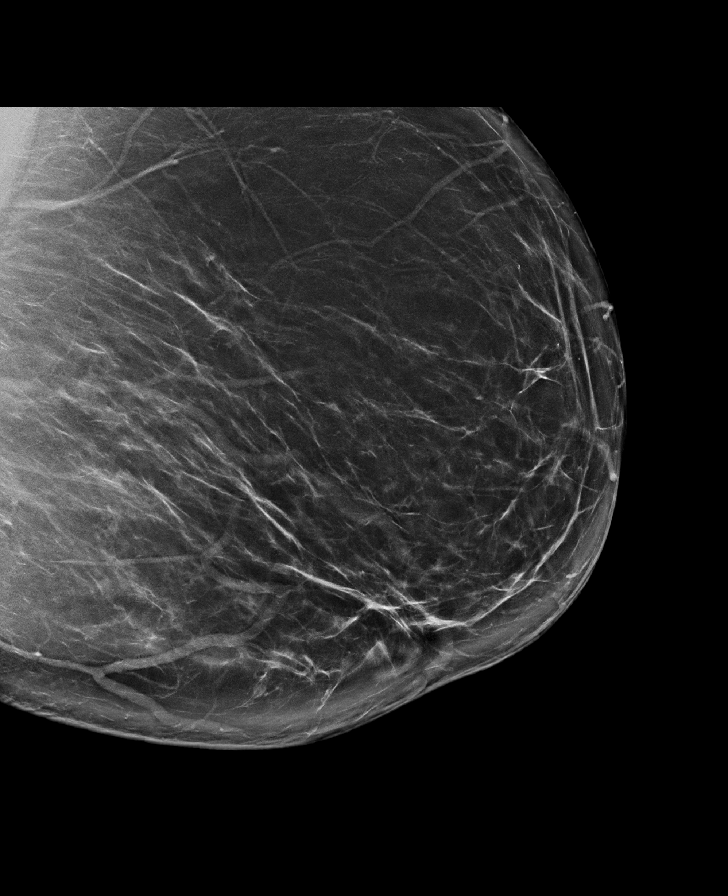

[R MLO synth-2D (1 of 2)]
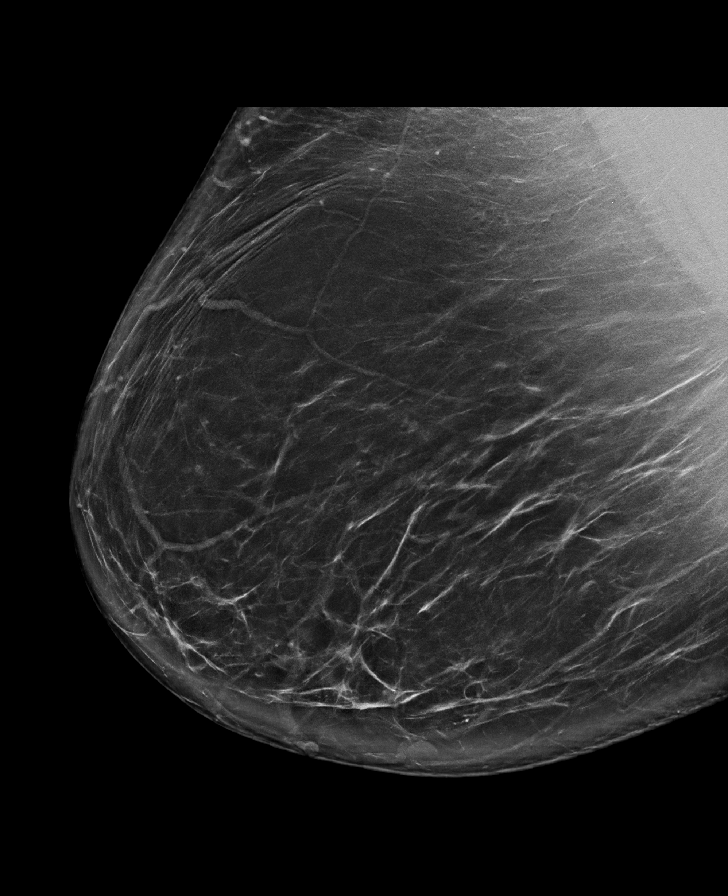

[L MLO synth-2D (2 of 2)]
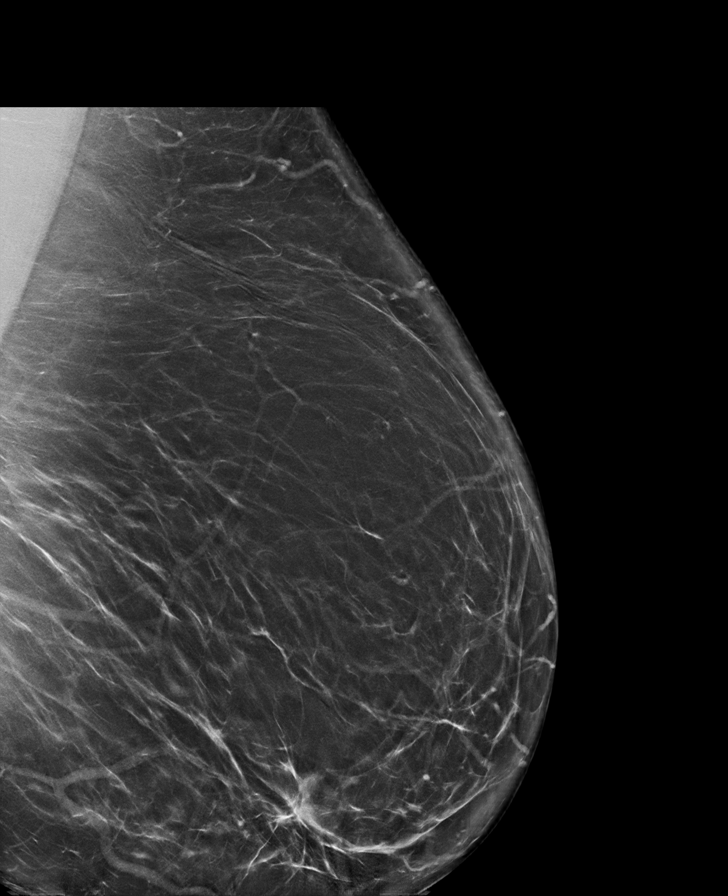

[L CC synth-2D (2 of 2)]
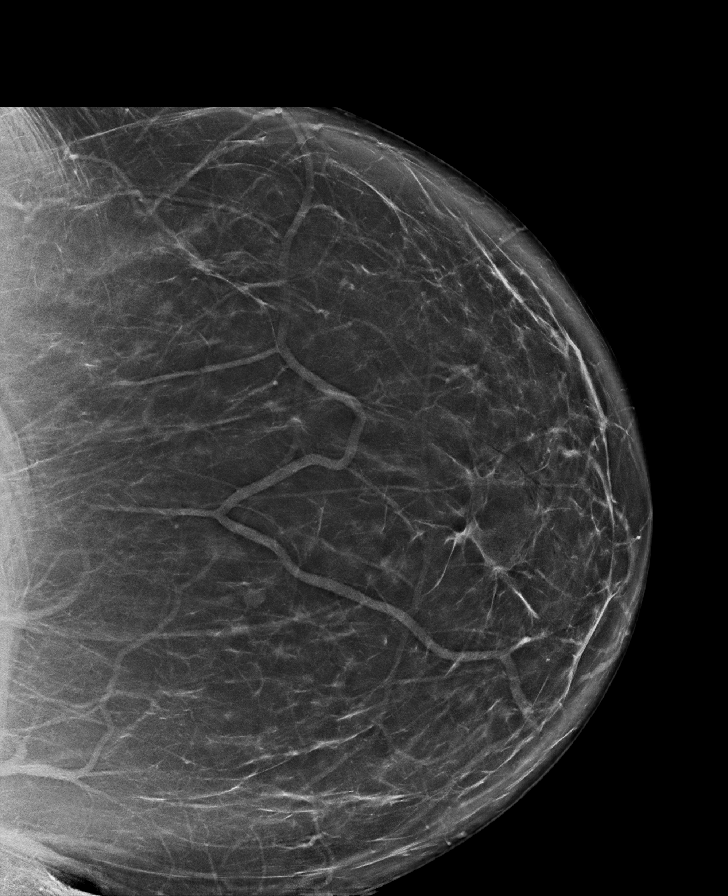

[R CV synth-2D]
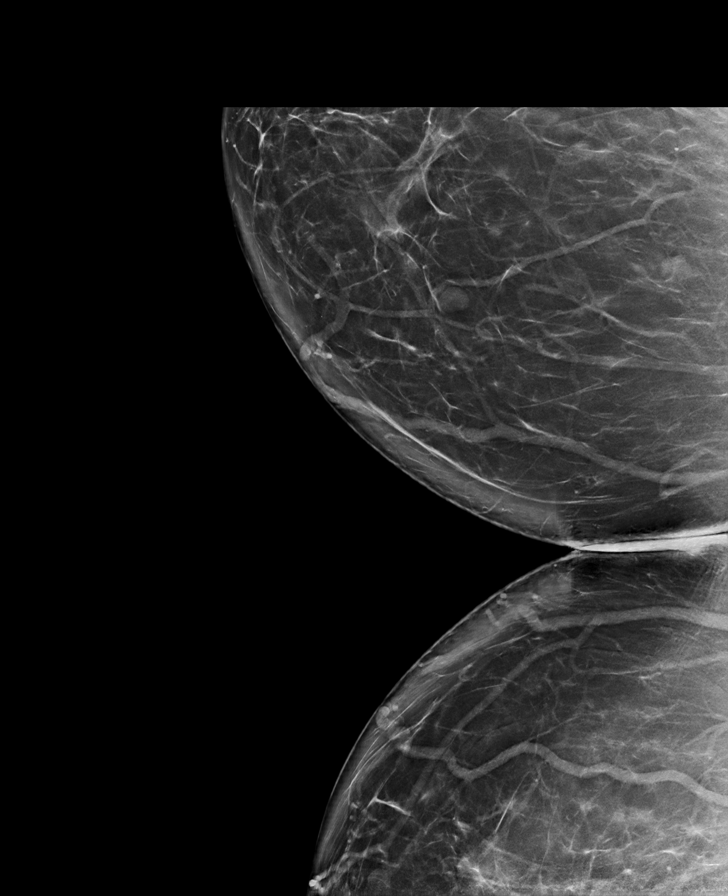

[R MLO synth-2D (2 of 2)]
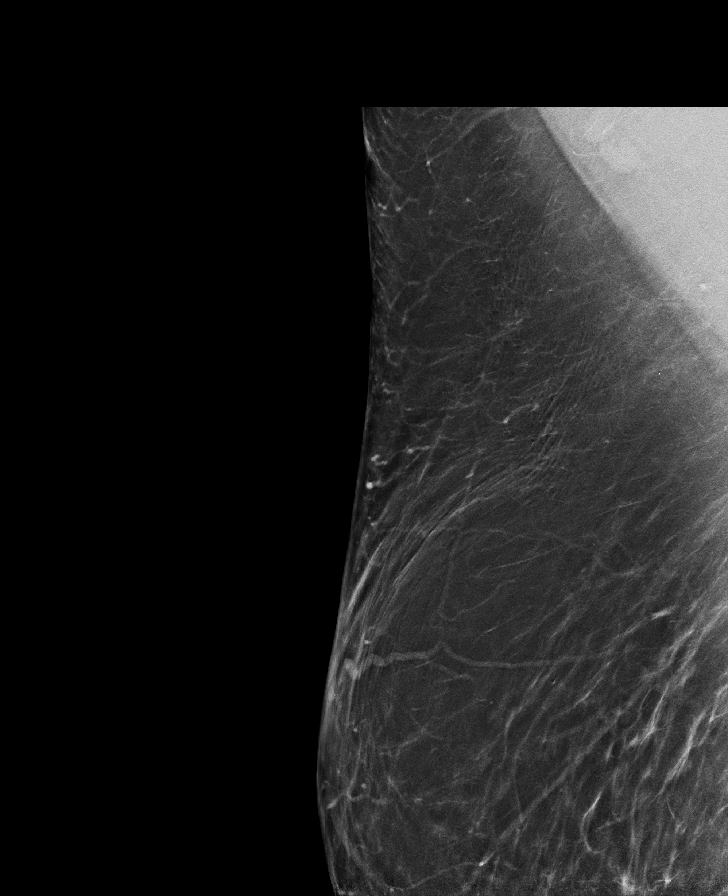

[R MLO tomo · tomo slice 55/110.0]
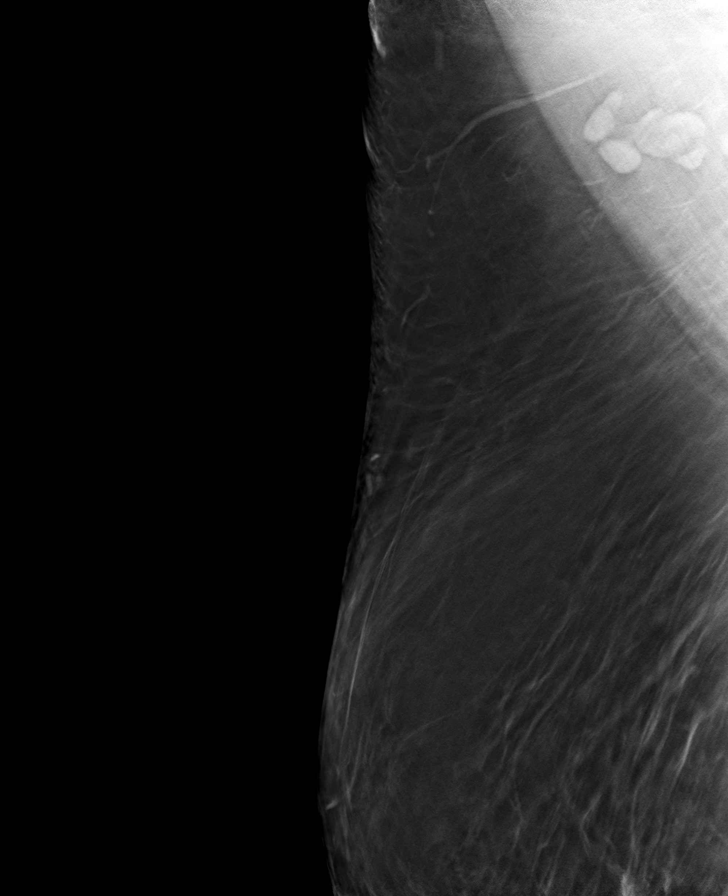

[8 of 40 positions shown; findings below may reference images not displayed]

ACR Breast Density Category b: There are scattered areas of
fibroglandular density.
FINDINGS: There are no findings suspicious for malignancy. Images were
processed with CAD.
IMPRESSION: No mammographic evidence of malignancy. A result letter of this
screening mammogram will be mailed directly to the patient.

RECOMMENDATION:
Screening mammogram in one year. (Code:[id number])

BI-RADS CATEGORY  1: Negative.

## 2020-06-22 DIAGNOSIS — E119 Type 2 diabetes mellitus without complications: Secondary | ICD-10-CM | POA: Diagnosis not present

## 2020-06-22 DIAGNOSIS — Z961 Presence of intraocular lens: Secondary | ICD-10-CM | POA: Diagnosis not present

## 2020-06-30 ENCOUNTER — Other Ambulatory Visit: Payer: Self-pay | Admitting: Family Medicine

## 2020-06-30 DIAGNOSIS — Z1231 Encounter for screening mammogram for malignant neoplasm of breast: Secondary | ICD-10-CM

## 2020-07-01 DIAGNOSIS — Z720 Tobacco use: Secondary | ICD-10-CM | POA: Diagnosis not present

## 2020-07-01 DIAGNOSIS — Z0000 Encounter for general adult medical examination without abnormal findings: Secondary | ICD-10-CM | POA: Diagnosis not present

## 2020-07-01 DIAGNOSIS — E785 Hyperlipidemia, unspecified: Secondary | ICD-10-CM | POA: Diagnosis not present

## 2020-07-01 DIAGNOSIS — E1169 Type 2 diabetes mellitus with other specified complication: Secondary | ICD-10-CM | POA: Diagnosis not present

## 2020-07-01 DIAGNOSIS — M549 Dorsalgia, unspecified: Secondary | ICD-10-CM | POA: Diagnosis not present

## 2020-07-01 DIAGNOSIS — Z794 Long term (current) use of insulin: Secondary | ICD-10-CM | POA: Diagnosis not present

## 2020-07-01 DIAGNOSIS — Z23 Encounter for immunization: Secondary | ICD-10-CM | POA: Diagnosis not present

## 2020-07-01 DIAGNOSIS — I10 Essential (primary) hypertension: Secondary | ICD-10-CM | POA: Diagnosis not present

## 2020-08-12 ENCOUNTER — Other Ambulatory Visit: Payer: Self-pay

## 2020-08-12 ENCOUNTER — Ambulatory Visit
Admission: RE | Admit: 2020-08-12 | Discharge: 2020-08-12 | Disposition: A | Discharge status: Home / Self Care | Payer: Medicare HMO | Source: Ambulatory Visit | Attending: Family Medicine | Admitting: Family Medicine

## 2020-08-12 DIAGNOSIS — Z1231 Encounter for screening mammogram for malignant neoplasm of breast: Secondary | ICD-10-CM | POA: Diagnosis not present

## 2020-08-26 DIAGNOSIS — Z961 Presence of intraocular lens: Secondary | ICD-10-CM | POA: Diagnosis not present

## 2020-09-02 DIAGNOSIS — M549 Dorsalgia, unspecified: Secondary | ICD-10-CM | POA: Diagnosis not present

## 2020-11-02 DIAGNOSIS — Z720 Tobacco use: Secondary | ICD-10-CM | POA: Diagnosis not present

## 2020-11-02 DIAGNOSIS — I10 Essential (primary) hypertension: Secondary | ICD-10-CM | POA: Diagnosis not present

## 2020-11-02 DIAGNOSIS — R946 Abnormal results of thyroid function studies: Secondary | ICD-10-CM | POA: Diagnosis not present

## 2020-11-02 DIAGNOSIS — M549 Dorsalgia, unspecified: Secondary | ICD-10-CM | POA: Diagnosis not present

## 2020-11-02 DIAGNOSIS — R062 Wheezing: Secondary | ICD-10-CM | POA: Diagnosis not present

## 2020-11-02 DIAGNOSIS — E1169 Type 2 diabetes mellitus with other specified complication: Secondary | ICD-10-CM | POA: Diagnosis not present

## 2020-11-02 DIAGNOSIS — R609 Edema, unspecified: Secondary | ICD-10-CM | POA: Diagnosis not present

## 2020-11-02 DIAGNOSIS — E785 Hyperlipidemia, unspecified: Secondary | ICD-10-CM | POA: Diagnosis not present

## 2020-11-05 DIAGNOSIS — M545 Low back pain, unspecified: Secondary | ICD-10-CM | POA: Diagnosis not present

## 2020-11-10 DIAGNOSIS — F329 Major depressive disorder, single episode, unspecified: Secondary | ICD-10-CM | POA: Diagnosis not present

## 2020-11-10 DIAGNOSIS — M1990 Unspecified osteoarthritis, unspecified site: Secondary | ICD-10-CM | POA: Diagnosis not present

## 2020-11-10 DIAGNOSIS — E785 Hyperlipidemia, unspecified: Secondary | ICD-10-CM | POA: Diagnosis not present

## 2020-11-10 DIAGNOSIS — I10 Essential (primary) hypertension: Secondary | ICD-10-CM | POA: Diagnosis not present

## 2020-11-10 DIAGNOSIS — E1169 Type 2 diabetes mellitus with other specified complication: Secondary | ICD-10-CM | POA: Diagnosis not present

## 2020-11-10 DIAGNOSIS — E039 Hypothyroidism, unspecified: Secondary | ICD-10-CM | POA: Diagnosis not present

## 2020-12-14 DIAGNOSIS — E039 Hypothyroidism, unspecified: Secondary | ICD-10-CM | POA: Diagnosis not present

## 2021-02-07 DIAGNOSIS — E1169 Type 2 diabetes mellitus with other specified complication: Secondary | ICD-10-CM | POA: Diagnosis not present

## 2021-02-07 DIAGNOSIS — E785 Hyperlipidemia, unspecified: Secondary | ICD-10-CM | POA: Diagnosis not present

## 2021-02-07 DIAGNOSIS — M1990 Unspecified osteoarthritis, unspecified site: Secondary | ICD-10-CM | POA: Diagnosis not present

## 2021-02-07 DIAGNOSIS — E039 Hypothyroidism, unspecified: Secondary | ICD-10-CM | POA: Diagnosis not present

## 2021-02-07 DIAGNOSIS — F329 Major depressive disorder, single episode, unspecified: Secondary | ICD-10-CM | POA: Diagnosis not present

## 2021-02-07 DIAGNOSIS — I10 Essential (primary) hypertension: Secondary | ICD-10-CM | POA: Diagnosis not present

## 2021-07-04 DIAGNOSIS — I10 Essential (primary) hypertension: Secondary | ICD-10-CM | POA: Diagnosis not present

## 2021-07-04 DIAGNOSIS — Z79899 Other long term (current) drug therapy: Secondary | ICD-10-CM | POA: Diagnosis not present

## 2021-07-04 DIAGNOSIS — E785 Hyperlipidemia, unspecified: Secondary | ICD-10-CM | POA: Diagnosis not present

## 2021-07-04 DIAGNOSIS — Z23 Encounter for immunization: Secondary | ICD-10-CM | POA: Diagnosis not present

## 2021-07-04 DIAGNOSIS — E039 Hypothyroidism, unspecified: Secondary | ICD-10-CM | POA: Diagnosis not present

## 2021-07-04 DIAGNOSIS — E559 Vitamin D deficiency, unspecified: Secondary | ICD-10-CM | POA: Diagnosis not present

## 2021-07-04 DIAGNOSIS — M47816 Spondylosis without myelopathy or radiculopathy, lumbar region: Secondary | ICD-10-CM | POA: Diagnosis not present

## 2021-07-04 DIAGNOSIS — E1169 Type 2 diabetes mellitus with other specified complication: Secondary | ICD-10-CM | POA: Diagnosis not present

## 2021-07-12 ENCOUNTER — Other Ambulatory Visit: Payer: Self-pay | Admitting: Registered Nurse

## 2021-07-12 DIAGNOSIS — Z1231 Encounter for screening mammogram for malignant neoplasm of breast: Secondary | ICD-10-CM

## 2021-07-29 DIAGNOSIS — E559 Vitamin D deficiency, unspecified: Secondary | ICD-10-CM | POA: Diagnosis not present

## 2021-07-29 DIAGNOSIS — Z23 Encounter for immunization: Secondary | ICD-10-CM | POA: Diagnosis not present

## 2021-07-29 DIAGNOSIS — Z6841 Body Mass Index (BMI) 40.0 and over, adult: Secondary | ICD-10-CM | POA: Diagnosis not present

## 2021-07-29 DIAGNOSIS — F17210 Nicotine dependence, cigarettes, uncomplicated: Secondary | ICD-10-CM | POA: Diagnosis not present

## 2021-07-29 DIAGNOSIS — E1169 Type 2 diabetes mellitus with other specified complication: Secondary | ICD-10-CM | POA: Diagnosis not present

## 2021-07-29 DIAGNOSIS — Z7189 Other specified counseling: Secondary | ICD-10-CM | POA: Diagnosis not present

## 2021-07-29 DIAGNOSIS — Z0001 Encounter for general adult medical examination with abnormal findings: Secondary | ICD-10-CM | POA: Diagnosis not present

## 2021-07-29 DIAGNOSIS — J449 Chronic obstructive pulmonary disease, unspecified: Secondary | ICD-10-CM | POA: Diagnosis not present

## 2021-07-29 DIAGNOSIS — E039 Hypothyroidism, unspecified: Secondary | ICD-10-CM | POA: Diagnosis not present

## 2021-08-16 ENCOUNTER — Ambulatory Visit
Admission: RE | Admit: 2021-08-16 | Discharge: 2021-08-16 | Disposition: A | Discharge status: Home / Self Care | Payer: Medicare HMO | Source: Ambulatory Visit | Attending: Registered Nurse | Admitting: Registered Nurse

## 2021-08-16 DIAGNOSIS — Z1231 Encounter for screening mammogram for malignant neoplasm of breast: Secondary | ICD-10-CM

## 2021-10-17 ENCOUNTER — Other Ambulatory Visit: Payer: Self-pay | Admitting: Gastroenterology

## 2021-12-08 ENCOUNTER — Ambulatory Visit: Payer: Medicare HMO | Admitting: Podiatrist

## 2021-12-08 ENCOUNTER — Encounter: Payer: Self-pay | Admitting: Podiatrist

## 2021-12-08 DIAGNOSIS — B351 Tinea unguium: Secondary | ICD-10-CM

## 2021-12-08 DIAGNOSIS — M79674 Pain in right toe(s): Secondary | ICD-10-CM

## 2021-12-08 DIAGNOSIS — M79675 Pain in left toe(s): Secondary | ICD-10-CM

## 2021-12-08 DIAGNOSIS — B353 Tinea pedis: Secondary | ICD-10-CM | POA: Diagnosis not present

## 2021-12-08 MED ORDER — KETOCONAZOLE 2 % EX CREA: TOPICAL_CREAM | CUTANEOUS | 4 refills | Status: DC

## 2021-12-08 NOTE — Progress Notes (Signed)
?Chief Complaint  ?Patient presents with  ? Diabetes  ?  Diabetic foot care  last BG: 127  ?  ? ?HPI: Patient is 66 y.o. female who presents today for diabetic foot care and trimming of her toenails.  She relates the nails have become elongated painful and thick and she is unable to trim.  In the past she has had the left first ingrown toenail borders removed.  She has a central spicule of the nail left which has not caused her problems.  He denies any numbness tingling or shooting sharp pains to the feet she states that she has itchiness around the toes and she tries not to scratch them. ? ?There are no problems to display for this patient. ? ? ?Current Outpatient Medications on File Prior to Visit  ?Medication Sig Dispense Refill  ? ACCU-CHEK AVIVA PLUS test strip     ? Acetaminophen 500 MG coapsule     ? B-D INS SYR ULTRAFINE 1CC/30G 30G X 1/2" 1 ML MISC     ? clotrimazole-betamethasone (LOTRISONE) cream     ? cyclobenzaprine (FLEXERIL) 10 MG tablet Take 10 mg by mouth at bedtime as needed. For sore muscles    ? diclofenac (VOLTAREN) 25 MG EC tablet Take 25 mg by mouth 2 (two) times daily.    ? DiphenhydrAMINE HCl (BENADRYL ALLERGY PO) Take by mouth.    ? ibuprofen (ADVIL,MOTRIN) 200 MG tablet Take 600 mg by mouth every 8 (eight) hours as needed. For pain    ? Incontinence Supply Disposable (BLADDER CONTROL PADS EX ABSORB) MISC     ? lisinopril (PRINIVIL,ZESTRIL) 20 MG tablet Take 20 mg by mouth daily.    ? lisinopril-hydrochlorothiazide (PRINZIDE,ZESTORETIC) 20-12.5 MG per tablet Take 1 tablet by mouth daily.    ? Phenylephrine-Acetaminophen 5-325 MG TABS     ? pravastatin (PRAVACHOL) 10 MG tablet     ? prednisoLONE acetate (PRED FORTE) 1 % ophthalmic suspension Place 1 drop into the right eye once.    ? Pseudoeph-Doxylamine-DM-APAP (NYQUIL PO) Take by mouth.    ? Pseudoephedrine-APAP-DM (DAYQUIL PO) Take by mouth.    ? sulfamethoxazole-trimethoprim (BACTRIM DS,SEPTRA DS) 800-160 MG tablet     ?  tetrahydrozoline 0.05 % ophthalmic solution Place 1 drop into the right eye 4 (four) times daily.    ? ?No current facility-administered medications on file prior to visit.  ? ? ?No Known Allergies ? ?Review of Systems ?No fevers, chills, nausea, muscle aches, no difficulty breathing, no calf pain, no chest pain or shortness of breath. ? ? ?Physical Exam ? ?GENERAL APPEARANCE: Alert, conversant. Appropriately groomed. No acute distress.  ? ?VASCULAR: Pedal pulses palpable DP and PT bilateral.  Capillary refill time is immediate to all digits,  Proximal to distal cooling it warm to warm.  Digital perfusion adequate.  ? ?NEUROLOGIC: sensation is intact to 5.07 monofilament at 5/5 sites bilateral.  Light touch is intact bilateral, vibratory sensation intact bilateral ? ?MUSCULOSKELETAL: acceptable muscle strength, tone and stability bilateral.  No gross boney pedal deformities noted.  No pain, crepitus or limitation noted with foot and ankle range of motion bilateral.  ? ?DERMATOLOGIC: skin is warm, supple, and dry.  Color, texture, and turgor of skin within normal limits.  No open wounds are noted.  No preulcerative lesions are seen.'s are thick, discolored, dystrophic and clinically mycotic pain with direct pressure is noted.  Left hallux nail has a central spicule of nail present.  It is not symptomatic.  Peeling of the  skin in a moccasin-like distribution is noted.  Some interdigital maceration is also seen. ? ? ? ?Assessment  ? ?  ICD-10-CM   ?1. Pain due to onychomycosis of toenails of both feet  B35.1   ? A21.308   ? M57.846   ?  ?2. Tinea pedis of both feet  B35.3   ?  ? ? ? ?Plan ? ?Discussed exam findings and treatment recommendations.  At today's visit I debrided the toenails and thickness and length via manual and mechanical means with the sterile nail nipper and power bur.  Also prescribed ketoconazole for her to use on the itchy places of her foot twice daily for 4 weeks.  She will be seen back in 3  months for continued foot care if any problems or concerns arise prior to that visit she will call. ?

## 2021-12-08 NOTE — Patient Instructions (Signed)
Diabetes Mellitus and Foot Care Foot care is an important part of your health, especially when you have diabetes. Diabetes may cause you to have problems because of poor blood flow (circulation) to your feet and legs, which can cause your skin to: Become thinner and drier. Break more easily. Heal more slowly. Peel and crack. You may also have nerve damage (neuropathy) in your legs and feet, causing decreased feeling in them. This means that you may not notice minor injuries to your feet that could lead to more serious problems. Noticing and addressing any potential problems early is the best way to prevent future foot problems. How to care for your feet Foot hygiene  Wash your feet daily with warm water and mild soap. Do not use hot water. Then, pat your feet and the areas between your toes until they are completely dry. Do not soak your feet as this can dry your skin. Trim your toenails straight across. Do not dig under them or around the cuticle. File the edges of your nails with an emery board or nail file. Apply a moisturizing lotion or petroleum jelly to the skin on your feet and to dry, brittle toenails. Use lotion that does not contain alcohol and is unscented. Do not apply lotion between your toes. Shoes and socks Wear clean socks or stockings every day. Make sure they are not too tight. Do not wear knee-high stockings since they may decrease blood flow to your legs. Wear shoes that fit properly and have enough cushioning. Always look in your shoes before you put them on to be sure there are no objects inside. To break in new shoes, wear them for just a few hours a day. This prevents injuries on your feet. Wounds, scrapes, corns, and calluses  Check your feet daily for blisters, cuts, bruises, sores, and redness. If you cannot see the bottom of your feet, use a mirror or ask someone for help. Do not cut corns or calluses or try to remove them with medicine. If you find a minor scrape,  cut, or break in the skin on your feet, keep it and the skin around it clean and dry. You may clean these areas with mild soap and water. Do not clean the area with peroxide, alcohol, or iodine. If you have a wound, scrape, corn, or callus on your foot, look at it several times a day to make sure it is healing and not infected. Check for: Redness, swelling, or pain. Fluid or blood. Warmth. Pus or a bad smell. General tips Do not cross your legs. This may decrease blood flow to your feet. Do not use heating pads or hot water bottles on your feet. They may burn your skin. If you have lost feeling in your feet or legs, you may not know this is happening until it is too late. Protect your feet from hot and cold by wearing shoes, such as at the beach or on hot pavement. Schedule a complete foot exam at least once a year (annually) or more often if you have foot problems. Report any cuts, sores, or bruises to your health care provider immediately. Where to find more information American Diabetes Association: www.diabetes.org Association of Diabetes Care & Education Specialists: www.diabeteseducator.org Contact a health care provider if: You have a medical condition that increases your risk of infection and you have any cuts, sores, or bruises on your feet. You have an injury that is not healing. You have redness on your legs or feet. You   feel burning or tingling in your legs or feet. You have pain or cramps in your legs and feet. Your legs or feet are numb. Your feet always feel cold. You have pain around any toenails. Get help right away if: You have a wound, scrape, corn, or callus on your foot and: You have pain, swelling, or redness that gets worse. You have fluid or blood coming from the wound, scrape, corn, or callus. Your wound, scrape, corn, or callus feels warm to the touch. You have pus or a bad smell coming from the wound, scrape, corn, or callus. You have a fever. You have a red  line going up your leg. Summary Check your feet every day for blisters, cuts, bruises, sores, and redness. Apply a moisturizing lotion or petroleum jelly to the skin on your feet and to dry, brittle toenails. Wear shoes that fit properly and have enough cushioning. If you have foot problems, report any cuts, sores, or bruises to your health care provider immediately. Schedule a complete foot exam at least once a year (annually) or more often if you have foot problems. This information is not intended to replace advice given to you by your health care provider. Make sure you discuss any questions you have with your health care provider. Document Revised: 02/19/2020 Document Reviewed: 02/19/2020 Elsevier Patient Education  2023 [employer] Inc.  

## 2022-02-06 ENCOUNTER — Encounter (HOSPITAL_COMMUNITY): Payer: Self-pay | Admitting: Gastroenterology

## 2022-02-14 NOTE — Anesthesia Preprocedure Evaluation (Signed)
Anesthesia Evaluation  Patient identified by MRN, date of birth, ID band Patient awake    Reviewed: Allergy & Precautions, NPO status , Patient's Chart, lab work & pertinent test results  Airway Mallampati: III  TM Distance: >3 FB Neck ROM: Full    Dental  (+) Missing, Chipped, Dental Advisory Given, Edentulous Upper,    Pulmonary Current Smoker and Patient abstained from smoking.,    Pulmonary exam normal breath sounds clear to auscultation       Cardiovascular hypertension, Normal cardiovascular exam Rhythm:Regular Rate:Normal     Neuro/Psych negative neurological ROS  negative psych ROS   GI/Hepatic negative GI ROS, Neg liver ROS,   Endo/Other  diabetes, Type 2, Insulin DependentHypothyroidism Morbid obesity  Renal/GU negative Renal ROS  negative genitourinary   Musculoskeletal  (+) Arthritis ,   Abdominal   Peds  Hematology negative hematology ROS (+)   Anesthesia Other Findings   Reproductive/Obstetrics                            Anesthesia Physical Anesthesia Plan  ASA: 3  Anesthesia Plan: MAC   Post-op Pain Management:    Induction: Intravenous  PONV Risk Score and Plan: Propofol infusion and Treatment may vary due to age or medical condition  Airway Management Planned: Natural Airway  Additional Equipment:   Intra-op Plan:   Post-operative Plan:   Informed Consent: I have reviewed the patients History and Physical, chart, labs and discussed the procedure including the risks, benefits and alternatives for the proposed anesthesia with the patient or authorized representative who has indicated his/her understanding and acceptance.     Dental advisory given  Plan Discussed with: CRNA  Anesthesia Plan Comments:         Anesthesia Quick Evaluation

## 2022-02-15 ENCOUNTER — Encounter (HOSPITAL_COMMUNITY)
Admission: RE | Disposition: A | Discharge status: Home / Self Care | Payer: Self-pay | Source: Home / Self Care | Attending: Gastroenterology

## 2022-02-15 ENCOUNTER — Ambulatory Visit (HOSPITAL_COMMUNITY): Payer: Medicare HMO | Admitting: Anesthesiology

## 2022-02-15 ENCOUNTER — Other Ambulatory Visit: Payer: Self-pay

## 2022-02-15 ENCOUNTER — Ambulatory Visit (HOSPITAL_COMMUNITY)
Admission: RE | Admit: 2022-02-15 | Discharge: 2022-02-15 | Disposition: A | Discharge status: Home / Self Care | Payer: Medicare HMO | Attending: Gastroenterology | Admitting: Gastroenterology

## 2022-02-15 ENCOUNTER — Ambulatory Visit (HOSPITAL_BASED_OUTPATIENT_CLINIC_OR_DEPARTMENT_OTHER): Payer: Medicare HMO | Admitting: Anesthesiology

## 2022-02-15 ENCOUNTER — Encounter (HOSPITAL_COMMUNITY): Payer: Self-pay | Admitting: Gastroenterology

## 2022-02-15 DIAGNOSIS — Z6841 Body Mass Index (BMI) 40.0 and over, adult: Secondary | ICD-10-CM | POA: Insufficient documentation

## 2022-02-15 DIAGNOSIS — K649 Unspecified hemorrhoids: Secondary | ICD-10-CM | POA: Diagnosis not present

## 2022-02-15 DIAGNOSIS — E039 Hypothyroidism, unspecified: Secondary | ICD-10-CM | POA: Insufficient documentation

## 2022-02-15 DIAGNOSIS — I10 Essential (primary) hypertension: Secondary | ICD-10-CM

## 2022-02-15 DIAGNOSIS — E119 Type 2 diabetes mellitus without complications: Secondary | ICD-10-CM | POA: Diagnosis not present

## 2022-02-15 DIAGNOSIS — Z794 Long term (current) use of insulin: Secondary | ICD-10-CM | POA: Diagnosis not present

## 2022-02-15 DIAGNOSIS — F17210 Nicotine dependence, cigarettes, uncomplicated: Secondary | ICD-10-CM | POA: Insufficient documentation

## 2022-02-15 DIAGNOSIS — Z1211 Encounter for screening for malignant neoplasm of colon: Secondary | ICD-10-CM | POA: Insufficient documentation

## 2022-02-15 HISTORY — PX: COLONOSCOPY WITH PROPOFOL: SHX5780

## 2022-02-15 LAB — GLUCOSE, CAPILLARY
Glucose-Capillary: 114 mg/dL — ABNORMAL HIGH (ref 70–99)
Glucose-Capillary: 99 mg/dL (ref 70–99)

## 2022-02-15 SURGERY — COLONOSCOPY WITH PROPOFOL
Anesthesia: Monitor Anesthesia Care

## 2022-02-15 MED ORDER — SODIUM CHLORIDE 0.9 % IV SOLN: INTRAVENOUS | Status: DC | PRN

## 2022-02-15 MED ORDER — LIDOCAINE HCL (CARDIAC) PF 100 MG/5ML IV SOSY
PREFILLED_SYRINGE | INTRAVENOUS | Status: DC | PRN
  Administered 2022-02-15: 100 mg via INTRAVENOUS

## 2022-02-15 MED ORDER — PROPOFOL 1000 MG/100ML IV EMUL
INTRAVENOUS | Status: AC
  Filled 2022-02-15: qty 100

## 2022-02-15 MED ORDER — PROPOFOL 500 MG/50ML IV EMUL
INTRAVENOUS | Status: DC | PRN
  Administered 2022-02-15: 50 mg via INTRAVENOUS
  Administered 2022-02-15: 75 ug/kg/min via INTRAVENOUS

## 2022-02-15 MED ORDER — LACTATED RINGERS IV SOLN: INTRAVENOUS | Status: DC

## 2022-02-15 SURGICAL SUPPLY — 22 items

## 2022-02-15 NOTE — Op Note (Signed)
[facility] Patient Name: Jeanne [name] Procedure Date: 02/15/2022 MRN: [id number] Attending MD: Willis Modena , MD Date of Birth: [birth year] CSN: [id number] Age: 66 Admit Type: Outpatient Procedure:                Colonoscopy Indications:              Screening for colorectal malignant neoplasm, Last                            colonoscopy: 2012 Providers:                Willis Modena, MD, Benjaman Lobe, RN, Priscella Mann,                            Technician Referring MD:              Medicines:                Monitored Anesthesia Care Complications:            No immediate complications. Estimated Blood Loss:     Estimated blood loss: none. Procedure:                Pre-Anesthesia Assessment:                           - Prior to the procedure, a History and Physical                            was performed, and patient medications and                            allergies were reviewed. The patient's tolerance of                            previous anesthesia was also reviewed. The risks                            and benefits of the procedure and the sedation                            options and risks were discussed with the patient.                            All questions were answered, and informed consent                            was obtained. Prior Anticoagulants: The patient has                            taken no previous anticoagulant or antiplatelet                            agents. ASA Grade Assessment: III - A patient with                            severe systemic disease. After  reviewing the risks                            and benefits, the patient was deemed in                            satisfactory condition to undergo the procedure.                           After obtaining informed consent, the colonoscope                            was passed under direct vision. Throughout the                            procedure, the patient's blood  pressure, pulse, and                            oxygen saturations were monitored continuously. The                            [id number] [id number]) Olympus colonoscope was                            introduced through the anus and advanced to the the                            cecum, identified by the ileocecal valve. The                            ileocecal valve, appendiceal orifice, and rectum                            were photographed. The entire colon was examined.                            The colonoscopy was performed without difficulty.                            The patient tolerated the procedure well. The                            quality of the bowel preparation was inadequate. Scope In: 9:41:11 AM Scope Out: 10:13:19 AM Scope Withdrawal Time: 0 hours 10 minutes 29 seconds  Total Procedure Duration: 0 hours 32 minutes 8 seconds  Findings:      Hemorrhoids were found on perianal exam.      Bowel prep diffusely fair (in places) or inadequate (in other places);       semisolid and viscous stool obscured multiple views throughout colon,       especially the cecum and proximal ascending colon; lesions could easily       have been missed.      The exam was otherwise without abnormality on direct and retroflexion       views. Impression:               -  Preparation of the colon was inadequate.                           - Hemorrhoids found on perianal exam.                           - The examination was otherwise normal on direct                            and retroflexion views.                           - No specimens collected. Moderate Sedation:      Not Applicable - Patient had care per Anesthesia. Recommendation:           - Discharge patient to home (via wheelchair).                           - Resume previous diet today.                           - Continue present medications.                           - Repeat colonoscopy versus other screening                             modality needed at appointment to be scheduled                            because today's examination was inadequate.                           - Return to GI office at appointment to be                            scheduled, to discuss conventional colonoscopy                            versus other screening options (stool studies,                            virtual colonoscopy).                           - Return to referring physician as previously                            scheduled. Procedure Code(s):        --- Professional ---                           [phone], Colonoscopy, flexible; diagnostic, including                            collection of specimen(s) by brushing or washing,  when performed (separate procedure) Diagnosis Code(s):        --- Professional ---                           Z12.11, Encounter for screening for malignant                            neoplasm of colon                           K64.9, Unspecified hemorrhoids CPT copyright 2019 American Medical Association. All rights reserved. The codes documented in this report are preliminary and upon coder review may  be revised to meet current compliance requirements. Arta Silence, MD 02/15/2022 10:17:25 AM This report has been signed electronically. Number of Addenda: 0

## 2022-02-15 NOTE — Discharge Instructions (Signed)
YOU HAD AN ENDOSCOPIC PROCEDURE TODAY: Refer to the procedure report and other information in the discharge instructions given to you for any specific questions about what was found during the examination. If this information does not answer your questions, please call the [facility] office at [phone] to clarify.   YOU SHOULD EXPECT: Some feelings of bloating in the abdomen. Passage of more gas than usual. Walking can help get rid of the air that was put into your GI tract during the procedure and reduce the bloating. If you had a lower endoscopy (such as a colonoscopy or flexible sigmoidoscopy) you may notice spotting of blood in your stool or on the toilet paper. Some abdominal soreness may be present for a day or two, also.  DIET: Your first meal following the procedure should be a light meal and then it is ok to progress to your normal diet. A half-sandwich or bowl of soup is an example of a good first meal. Heavy or fried foods are harder to digest and may make you feel nauseous or bloated. Drink plenty of fluids but you should avoid alcoholic beverages for 24 hours.  ACTIVITY: Your care partner should take you home directly after the procedure. You should plan to take it easy, moving slowly for the rest of the day. You can resume normal activity the day after the procedure however YOU SHOULD NOT DRIVE, use power tools, machinery or perform tasks that involve climbing or major physical exertion for 24 hours (because of the sedation medicines used during the test).   SYMPTOMS TO REPORT IMMEDIATELY: A gastroenterologist can be reached at any hour. Please call [phone]  for any of the following symptoms:  Following lower endoscopy (colonoscopy, flexible sigmoidoscopy) Excessive amounts of blood in the stool  Significant tenderness, worsening of abdominal pains  Swelling of the abdomen that is new, acute  Fever of 100 or higher    FOLLOW UP:  If any biopsies were taken you will be  contacted by phone or by letter within the next 1-3 weeks. Call [phone]  if you have not heard about the biopsies in 3 weeks.  Please also call with any specific questions about appointments or follow up tests. 

## 2022-02-15 NOTE — H&P (Signed)
[facility] H/P Note  Chief Complaint: Colon cancer screening  HPI: Jeanne [name] is an 66 y.o. female.  Here for average-risk colon cancer screening.  Last colonoscopy 2012 with no precancerous polyps seen.  No abdominal pain, change in bowel habits, blood in stool, unintentional weight loss.  There is no known family history of colon cancer or polyps.  Past Medical History:  Diagnosis Date   Abnormal Pap smear    Arthritis    Borderline diabetic    Hyperlipidemia    Hypertension     Past Surgical History:  Procedure Laterality Date   CESAREAN SECTION      Medications Prior to Admission  Medication Sig Dispense Refill   acetaminophen (TYLENOL) 500 MG tablet Take 1,000 mg by mouth daily as needed (Headache).     albuterol (VENTOLIN HFA) 108 (90 Base) MCG/ACT inhaler Inhale 2 puffs into the lungs every 6 (six) hours as needed for shortness of breath or wheezing.     Cholecalciferol (VITAMIN D3) 50 MCG (2000 UT) TABS Take 4,000 Units by mouth daily.     cyclobenzaprine (FLEXERIL) 10 MG tablet Take 10 mg by mouth 2 (two) times daily as needed for muscle spasms. For sore muscles     diclofenac (VOLTAREN) 75 MG EC tablet Take 75 mg by mouth 2 (two) times daily.     ketoconazole (NIZORAL) 2 % cream Apply to feet twice a day for 4 weeks (Patient taking differently: Apply 1 Application topically daily as needed (itching feet).) 60 g 4   levothyroxine (SYNTHROID) 50 MCG tablet Take 50 mcg by mouth daily before breakfast.     lisinopril-hydrochlorothiazide (PRINZIDE,ZESTORETIC) 20-12.5 MG per tablet Take 1 tablet by mouth daily.     NOVOLOG MIX 70/30 (70-30) 100 UNIT/ML injection Inject 25-30 Units into the skin 2 (two) times daily. Sliding scale     pravastatin (PRAVACHOL) 20 MG tablet Take 20 mg by mouth daily.     TRULICITY 0.75 MG/0.5ML SOPN Inject 0.75 mg into the skin once a week.     Docusate Calcium (STOOL SOFTENER PO) Take 1 tablet by mouth daily as needed (Constipation).       Allergies: No Known Allergies  Family History  Problem Relation Age of Onset   Hypertension Maternal Grandmother    Alcohol abuse Maternal Grandmother    Diabetes Mother    Heart disease Mother    Hypertension Mother    Alcohol abuse Mother    Heart disease Sister    Alcohol abuse Brother    Alcohol abuse Sister    Hypertension Maternal Aunt    Breast cancer Neg Hx     Social History:  reports that she has been smoking cigarettes. She has a 41.00 pack-year smoking history. She has never used smokeless tobacco. She reports that she does not drink alcohol and does not use drugs.   ROS: As per HPI, all others negative   Blood pressure (!) 152/88, pulse 98, temperature 98.2 F (36.8 C), temperature source Oral, resp. rate (!) 23, SpO2 98 %. General appearance: NAD HEENT:  [place]/AT, anicteric NECK:  Thick, no JVD CV:  Regular ABD:  Soft, protuberant, non-tender NEURO:  A/O, non-focal  Results for orders placed or performed during the hospital encounter of 02/15/22 (from the past 48 hour(s))  Glucose, capillary     Status: Abnormal   Collection Time: 02/15/22  8:07 AM  Result Value Ref Range   Glucose-Capillary 114 (H) 70 - 99 mg/dL    Comment: Glucose  reference range applies only to samples taken after fasting for at least 8 hours.   No results found.  Assessment/Plan   Colon cancer screening, average-risk.  Last colonoscopy 2012 without precancerous polyps seen at that time. Colonoscopy today. Risks (bleeding, infection, bowel perforation that could require surgery, sedation-related changes in cardiopulmonary systems), benefits (identification and possible treatment of source of symptoms, exclusion of certain causes of symptoms), and alternatives (watchful waiting, radiographic imaging studies, empiric medical treatment) of colonoscopy were explained to patient/family in detail and patient wishes to proceed.   Freddy Jaksch 02/15/2022, 8:55 AM

## 2022-02-15 NOTE — Transfer of Care (Signed)
Immediate Anesthesia Transfer of Care Note  Patient: Jeanne [name]  Procedure(s) Performed: COLONOSCOPY WITH PROPOFOL  Patient Location: PACU  Anesthesia Type:MAC  Level of Consciousness: awake  Airway & Oxygen Therapy: Patient Spontanous Breathing  Post-op Assessment: Report given to RN  Post vital signs: stable  Last Vitals:  Vitals Value Taken Time  BP 128/79 02/15/22 1021  Temp 36.2 C 02/15/22 1021  Pulse 87 02/15/22 1026  Resp 44 02/15/22 1026  SpO2 97 % 02/15/22 1026  Vitals shown include unvalidated device data.  Last Pain:  Vitals:   02/15/22 1021  TempSrc: Temporal  PainSc: 0-No pain         Complications: No notable events documented.

## 2022-02-15 NOTE — Anesthesia Postprocedure Evaluation (Signed)
Anesthesia Post Note  Patient: Jeanne [name]  Procedure(s) Performed: COLONOSCOPY WITH PROPOFOL     Patient location during evaluation: Endoscopy Anesthesia Type: MAC Level of consciousness: awake and alert Pain management: pain level controlled Vital Signs Assessment: post-procedure vital signs reviewed and stable Respiratory status: spontaneous breathing, nonlabored ventilation, respiratory function stable and patient connected to nasal cannula oxygen Cardiovascular status: blood pressure returned to baseline and stable Postop Assessment: no apparent nausea or vomiting Anesthetic complications: no   No notable events documented.  Last Vitals:  Vitals:   02/15/22 1031 02/15/22 1041  BP: 130/77 129/76  Pulse: 79 76  Resp: 20 15  Temp:    SpO2: 97% 99%    Last Pain:  Vitals:   02/15/22 1041  TempSrc:   PainSc: 0-No pain                 Nicholas Ossa L Jeremih Dearmas

## 2022-02-16 ENCOUNTER — Encounter (HOSPITAL_COMMUNITY): Payer: Self-pay | Admitting: Gastroenterology

## 2022-02-23 ENCOUNTER — Other Ambulatory Visit: Payer: Self-pay | Admitting: Registered Nurse

## 2022-02-23 DIAGNOSIS — E2839 Other primary ovarian failure: Secondary | ICD-10-CM

## 2022-07-04 ENCOUNTER — Telehealth: Payer: Self-pay | Admitting: Obstetrics and Gynecology

## 2022-07-04 NOTE — Telephone Encounter (Signed)
Telephone call to patient to move up her appointment per physician request.  Call goes straight to voicemail.    Left message for her to call.  Patient does not have active MyChart.

## 2022-07-10 ENCOUNTER — Encounter: Payer: Medicare HMO | Admitting: Obstetrics and Gynecology

## 2022-07-17 ENCOUNTER — Encounter: Payer: Medicare HMO | Admitting: Obstetrics and Gynecology

## 2022-07-17 ENCOUNTER — Telehealth: Payer: Self-pay

## 2022-07-17 NOTE — Telephone Encounter (Signed)
Na

## 2022-07-25 ENCOUNTER — Other Ambulatory Visit: Payer: Self-pay | Admitting: Obstetrics and Gynecology

## 2022-07-25 DIAGNOSIS — N950 Postmenopausal bleeding: Secondary | ICD-10-CM

## 2022-08-03 ENCOUNTER — Ambulatory Visit
Admission: RE | Admit: 2022-08-03 | Discharge: 2022-08-03 | Disposition: A | Discharge status: Home / Self Care | Payer: Medicare HMO | Source: Ambulatory Visit | Attending: Registered Nurse | Admitting: Registered Nurse

## 2022-08-03 DIAGNOSIS — E2839 Other primary ovarian failure: Secondary | ICD-10-CM

## 2022-08-09 ENCOUNTER — Ambulatory Visit
Admission: RE | Admit: 2022-08-09 | Discharge: 2022-08-09 | Disposition: A | Discharge status: Home / Self Care | Payer: Medicare HMO | Source: Ambulatory Visit | Attending: Obstetrics and Gynecology | Admitting: Obstetrics and Gynecology

## 2022-08-09 DIAGNOSIS — N950 Postmenopausal bleeding: Secondary | ICD-10-CM

## 2022-08-22 ENCOUNTER — Encounter: Payer: Medicare HMO | Admitting: Obstetrics and Gynecology

## 2022-09-04 ENCOUNTER — Other Ambulatory Visit (HOSPITAL_COMMUNITY)
Admission: RE | Admit: 2022-09-04 | Discharge: 2022-09-04 | Disposition: A | Discharge status: Home / Self Care | Payer: Medicare (Managed Care) | Source: Ambulatory Visit | Attending: Obstetrics and Gynecology | Admitting: Obstetrics and Gynecology

## 2022-09-04 ENCOUNTER — Encounter: Payer: Self-pay | Admitting: Obstetrics and Gynecology

## 2022-09-04 ENCOUNTER — Ambulatory Visit (INDEPENDENT_AMBULATORY_CARE_PROVIDER_SITE_OTHER): Payer: Medicare (Managed Care) | Admitting: Obstetrics and Gynecology

## 2022-09-04 VITALS — BP 131/80 | HR 87 | Ht 68.0 in | Wt 349.9 lb

## 2022-09-04 DIAGNOSIS — Z1151 Encounter for screening for human papillomavirus (HPV): Secondary | ICD-10-CM | POA: Diagnosis not present

## 2022-09-04 DIAGNOSIS — Z124 Encounter for screening for malignant neoplasm of cervix: Secondary | ICD-10-CM | POA: Insufficient documentation

## 2022-09-04 DIAGNOSIS — N950 Postmenopausal bleeding: Secondary | ICD-10-CM | POA: Diagnosis present

## 2022-09-04 NOTE — Progress Notes (Signed)
67 yo P2 postmenopausal since age of 63 with BMI 21 here for evaluation of postmenopausal vaginal bleeding. Patient reports onset of vaginal bleeding approximately 2 months ago. She reports an episode of heavy vaginal bleeding for a week followed by daily spotting. The vaginal bleeding has now been reduced to occasional pink discharge noted when she wipes. She reports some occasional lower abdominal pain. She is without any other concerns or complaints. Patient is not sexually active. She is current on her mammogram and colonoscopy  Past Medical History:  Diagnosis Date   Abnormal Pap smear    Arthritis    Borderline diabetic    Hyperlipidemia    Hypertension    Past Surgical History:  Procedure Laterality Date   CESAREAN SECTION     COLONOSCOPY WITH PROPOFOL N/A 02/15/2022   Procedure: COLONOSCOPY WITH PROPOFOL;  Surgeon: Arta Silence, MD;  Location: [facility];  Service: Gastroenterology;  Laterality: N/A;   Family History  Problem Relation Age of Onset   Hypertension Maternal Grandmother    Alcohol abuse Maternal Grandmother    Diabetes Mother    Heart disease Mother    Hypertension Mother    Alcohol abuse Mother    Heart disease Sister    Alcohol abuse Brother    Alcohol abuse Sister    Hypertension Maternal Aunt    Breast cancer Neg Hx    Social History   Tobacco Use   Smoking status: Every Day    Packs/day: 1.00    Years: 41.00    Total pack years: 41.00    Types: Cigarettes   Smokeless tobacco: Never  Substance Use Topics   Alcohol use: No   Drug use: No   ROS See pertinent in HPI. All other systems reviewed and non contributory Blood pressure 131/80, pulse 87, height 5\' 8"  (1.727 m), weight (!) 349 lb 14.4 oz (158.7 kg). GENERAL: Well-developed, well-nourished female in no acute distress.  ABDOMEN: Soft, nontender, nondistended. No organomegaly. PELVIC: Normal external female genitalia. Vagina is pink and rugated.  Normal discharge. Normal appearing cervix.  Bimanual exam limited secondary to body habitus. Chaperone present during the pelvic exam EXTREMITIES: No cyanosis, clubbing, or edema, 2+ distal pulses.  US PELVIC COMPLETE WITH TRANSVAGINAL  Result Date: 08/09/2022 CLINICAL DATA:  N 95.0, postmenopausal vaginal bleeding for 6 weeks EXAM: TRANSABDOMINAL AND TRANSVAGINAL ULTRASOUND OF PELVIS TECHNIQUE: Both transabdominal and transvaginal ultrasound examinations of the pelvis were performed. Transabdominal technique was performed for global imaging of the pelvis including uterus, ovaries, adnexal regions, and pelvic cul-de-sac. It was necessary to proceed with endovaginal exam following the transabdominal exam to visualize the uterus, endometrium, and ovaries. COMPARISON:  None Available. FINDINGS: Uterus Measurements: 12.8 x 8.4 x 10.0 cm = volume: 565 mL. Anteverted. Heterogeneous myometrium. RIGHT lateral subserosal leiomyoma 4.9 cm greatest diameter. LEFT lateral intramural leiomyoma 5.6 cm greatest diameter, comes in close proximity to the endometrial complex. Endometrium Thickness: 12 mm.  No endometrial fluid or mass. Right ovary Not visualized, likely obscured by bowel Left ovary Not visualized, likely obscured by bowel Other findings No free pelvic fluid or adnexal masses. IMPRESSION: Nonvisualization of ovaries. Two uterine leiomyomata as above. Scratch at thickened endometrial complex 12 mm thick, abnormal for postmenopausal patient with bleeding; in the setting of post-menopausal bleeding, endometrial sampling is indicated to exclude carcinoma. If results are benign, sonohysterogram should be considered for focal lesion work-up. (: Radiological Reasoning: Algorithmic Workup of Abnormal with Endovaginal Sonography and Sonohysterography. AJR. These results will be called to the  ordering clinician or representative by the Radiologist Assistant, and communication documented in the PACS or [employer]. Electronically Signed   By: Lavonia Dana M.D.    On: 08/09/2022 12:57     A/P 67 yo with postmenopausal vaginal bleeding - Reviewed ultrasound results with the patient - Discussed possible etiologies of vaginal bleeding such as polyp or endometrial cancer - Pap smear collected - Discussed benefits of endometrial biopsy ENDOMETRIAL BIOPSY     The indications for endometrial biopsy were reviewed.   Risks of the biopsy including cramping, bleeding, infection, uterine perforation, inadequate specimen and need for additional procedures  were discussed. The patient states she understands and agrees to undergo procedure today. Consent was signed. Time out was performed. Urine HCG was negative. A sterile speculum was placed in the patient's vagina and the cervix was prepped with Betadine. A single-toothed tenaculum was placed on the anterior lip of the cervix to stabilize it. The uterine cavity was sounded to a depth of 12 cm using the uterine sound. The 3 mm pipelle was introduced into the endometrial cavity without difficulty, 2 passes were made.  A  moderate amount of tissue was  sent to pathology. The instruments were removed from the patient's vagina. Minimal bleeding from the cervix was noted. The patient tolerated the procedure well.  Routine post-procedure instructions were given to the patient. The patient will follow up in two weeks to review the results and for further management.

## 2022-09-04 NOTE — Progress Notes (Signed)
New GYN VB postmenopausal, started 2 mo ago, one "major" bleed, now just spotting with wiping Occasional lower abdomen pain

## 2022-09-05 LAB — SURGICAL PATHOLOGY

## 2022-09-06 ENCOUNTER — Other Ambulatory Visit: Payer: Self-pay | Admitting: Registered Nurse

## 2022-09-06 DIAGNOSIS — Z1231 Encounter for screening mammogram for malignant neoplasm of breast: Secondary | ICD-10-CM

## 2022-09-11 ENCOUNTER — Telehealth: Payer: Self-pay

## 2022-09-11 LAB — CYTOLOGY - PAP
Comment: NEGATIVE
Diagnosis: NEGATIVE
High risk HPV: NEGATIVE

## 2022-09-11 NOTE — Telephone Encounter (Signed)
Patient called stating that she is having intermittent bleeding. Patient is requesting that megace be sent to the pharmacy until she is able to have her follow up appt.

## 2022-09-12 ENCOUNTER — Other Ambulatory Visit: Payer: Self-pay

## 2022-09-12 DIAGNOSIS — N950 Postmenopausal bleeding: Secondary | ICD-10-CM

## 2022-09-12 MED ORDER — MEGESTROL ACETATE 40 MG PO TABS: 40.0000 mg | ORAL_TABLET | Freq: Every day | ORAL | 0 refills | Status: DC

## 2022-09-25 ENCOUNTER — Telehealth (INDEPENDENT_AMBULATORY_CARE_PROVIDER_SITE_OTHER): Payer: Medicare (Managed Care) | Admitting: Obstetrics and Gynecology

## 2022-09-25 ENCOUNTER — Encounter: Payer: Self-pay | Admitting: Obstetrics and Gynecology

## 2022-09-25 DIAGNOSIS — Z712 Person consulting for explanation of examination or test findings: Secondary | ICD-10-CM | POA: Diagnosis not present

## 2022-09-25 DIAGNOSIS — N840 Polyp of corpus uteri: Secondary | ICD-10-CM

## 2022-09-25 DIAGNOSIS — N939 Abnormal uterine and vaginal bleeding, unspecified: Secondary | ICD-10-CM

## 2022-09-25 NOTE — Progress Notes (Signed)
GYNECOLOGY VIRTUAL VISIT ENCOUNTER NOTE  Provider location: [facility]   Patient location: Home  I connected with Jeanne [name] on 09/25/22 at  1:50 PM EST by MyChart Video Encounter and verified that I am speaking with the correct person using two identifiers.   I discussed the limitations, risks, security and privacy concerns of performing an evaluation and management service virtually and the availability of in person appointments. I also discussed with the patient that there may be a patient responsible charge related to this service. The patient expressed understanding and agreed to proceed.   History:  Jeanne [name] is a 67 y.o. [phone] female being evaluated today to discuss results of recent endometrial biopsy. Patient reports occasional vaginal spotting noted when she wipes only. She denies any abnormal vaginal discharge, bleeding, pelvic pain or other concerns.       Past Medical History:  Diagnosis Date   Abnormal Pap smear    Arthritis    Borderline diabetic    Diabetes mellitus without complication ([place])    Hyperlipidemia    Hypertension    Thyroid disease    Past Surgical History:  Procedure Laterality Date   CATARACT EXTRACTION  09/2019   CESAREAN SECTION     COLONOSCOPY WITH PROPOFOL N/A 02/15/2022   Procedure: COLONOSCOPY WITH PROPOFOL;  Surgeon: Arta Silence, MD;  Location: [facility];  Service: Gastroenterology;  Laterality: N/A;   The following portions of the patient's history were reviewed and updated as appropriate: allergies, current medications, past family history, past medical history, past social history, past surgical history and problem list.   Health Maintenance:  Normal pap and negative HRHPV on 08/2022.  Normal mammogram on 08/2021.   Review of Systems:  Pertinent items noted in HPI and remainder of comprehensive ROS otherwise negative.  Physical Exam:   General:  Alert, oriented and cooperative.  Patient appears to be in no acute distress.  Mental Status: Normal mood and affect. Normal behavior. Normal judgment and thought content.   Respiratory: Normal respiratory effort, no problems with respiration noted  Rest of physical exam deferred due to type of encounter  Labs and Imaging No results found for this or any previous visit (from the past 336 hour(s)). No results found.   09/04/22 endometrial biopsy results INAL MICROSCOPIC DIAGNOSIS:   A. ENDOMETRIUM, BIOPSY:  Multiple fragments compatible with benign endometrial polyp  Benign inactive to weakly proliferative endometrium  Negative for breakdown, cytologic atypia, hyperplasia and carcinoma    GROSS DESCRIPTION:   The specimen is received in formalin and consists of a 1.5 x 1.1 x 0.3  cm aggregate of tan-red soft tissue.  The specimen is entirely submitted  in 1 cassette.  Craig Staggers 09/04/2022)  Assessment and Plan:     67 yo postmenopausal here to discuss endometrial biopsy results. - Results reviewed with patient suggestive of presence of endometrial polyp. No evidence of malignancy - Discussed benefits of D&C with polypectomy to resolve the episodes of vaginal bleeding. Risks, benefits and alternatives were explained including but not limited to risks of bleeding, infection, uterine perforation and damage to adjacent organs. Patient verbalized understanding and all questions were answered - Patient desires to proceed      I discussed the assessment and treatment plan with the patient. The patient was provided an opportunity to ask questions and all were answered. The patient agreed with the plan and demonstrated an understanding of the instructions.   The patient was advised to  call back or seek an in-person evaluation/go to the ED if the symptoms worsen or if the condition fails to improve as anticipated.  I provided 15 minutes of face-to-face time during this encounter.   Mora Bellman, MD [facility]

## 2022-10-27 ENCOUNTER — Ambulatory Visit
Admission: RE | Admit: 2022-10-27 | Discharge: 2022-10-27 | Disposition: A | Discharge status: Home / Self Care | Payer: Medicare (Managed Care) | Source: Ambulatory Visit | Attending: Registered Nurse | Admitting: Registered Nurse

## 2022-10-27 DIAGNOSIS — Z1231 Encounter for screening mammogram for malignant neoplasm of breast: Secondary | ICD-10-CM

## 2022-12-11 ENCOUNTER — Encounter (HOSPITAL_COMMUNITY): Payer: Self-pay | Admitting: Physician Assistant

## 2022-12-11 NOTE — Progress Notes (Signed)
Spoke to patient on the phone for [facility], states her surgery has been cancelled.  States her insurance will not cover the procedure and she cannot pay the out of pocket cost of approx $19,000. States she called Dr. Deretha Emory surgery scheduler today to let them know the surgery was cancelled.

## 2022-12-12 ENCOUNTER — Ambulatory Visit (HOSPITAL_COMMUNITY)
Admission: RE | Admit: 2022-12-12 | Payer: Medicare (Managed Care) | Source: Home / Self Care | Admitting: Obstetrics and Gynecology

## 2022-12-12 SURGERY — DILATATION AND CURETTAGE /HYSTEROSCOPY
Anesthesia: Choice

## 2023-01-19 ENCOUNTER — Telehealth: Payer: Self-pay

## 2023-01-19 NOTE — Telephone Encounter (Signed)
Reached out to the patient to ensure her that we haven't forgotten to reschedule her surgery. Explained that Dr. Jolayne Panther doesn't have any openings in July and I'm waiting for the August schedule to be finalized for scheduling. Patient understood and wanted me to call if we had any cancellations.

## 2023-02-01 ENCOUNTER — Encounter: Payer: Self-pay | Admitting: *Deleted

## 2023-02-01 ENCOUNTER — Telehealth: Payer: Self-pay | Admitting: Obstetrics and Gynecology

## 2023-02-01 NOTE — Telephone Encounter (Signed)
Telephone call to patient regarding upcoming surgery.  Patient is posted for 03/13/23 at 10am at [facility].    Patient does take Ozempic and does her injections on Tuesdays.  Advised patient to skip her 03/06/23 dose as she must abstain for one week prior to surgery.    Patient requested a letter for reminder be mailed to her.

## 2023-02-09 ENCOUNTER — Telehealth: Payer: Self-pay

## 2023-02-09 NOTE — Telephone Encounter (Signed)
Contacted patient to confirm her surgery date of 03/13/23. Pt was aware and has received letter and phone call.

## 2023-02-13 SURGERY — Surgical Case
Anesthesia: *Unknown

## 2023-03-08 ENCOUNTER — Encounter (HOSPITAL_COMMUNITY): Payer: Self-pay | Admitting: Obstetrics and Gynecology

## 2023-03-09 ENCOUNTER — Encounter (HOSPITAL_BASED_OUTPATIENT_CLINIC_OR_DEPARTMENT_OTHER): Payer: Self-pay | Admitting: Obstetrics and Gynecology

## 2023-03-09 ENCOUNTER — Other Ambulatory Visit: Payer: Self-pay | Admitting: Obstetrics and Gynecology

## 2023-03-09 DIAGNOSIS — Z01812 Encounter for preprocedural laboratory examination: Secondary | ICD-10-CM

## 2023-03-09 NOTE — Progress Notes (Addendum)
Your procedure is scheduled on :  Tuesday  03-13-2023  Report to [facility] AT  _5:30__ AM.   Call this number if you have problems the morning of surgery  :[phone].   OUR ADDRESS IS [address].  WE ARE LOCATED IN [facility].  PLEASE BRING YOUR INSURANCE CARD AND PHOTO ID DAY OF SURGERY.  ONLY 2 PEOPLE ARE ALLOWED IN  WAITING  ROOM                                      REMEMBER:  Do eat any solid food after midnight night before surgery.  You may have clear liquids from midnight night before surgery until 4:30 AM.  No clear liquids after 4:30 AM day of surgery.  Nothing by Including no gum/ candy/ mints after 4:30 AM unless take medication with sips of water.   BRUSH YOUR TEETH MORNING OF SURGERY AND RINSE    CLEAR LIQUID DIET  Allowed      Water                                                                   Coffee and tea, regular and decaf  (NO cream or milk products of any type, may sweeten)                         Carbonated beverages, regular and diet                                    Sports drinks like Gatorade _____________________________________________________________________     TAKE ONLY THESE MEDICATIONS MORNING OF SURGERY:   Levothyroxine (Synthroid) Please bring both inhalers with you day of surgery. Do half dose Lantus insulin night before surgery                                      DO NOT WEAR JEWERLY/  METAL/  PIERCINGS (INCLUDING NO PLASTIC PIERCINGS) DO NOT WEAR LOTIONS, POWDERS, PERFUMES OR NAIL POLISH ON YOUR FINGERNAILS. TOENAIL POLISH IS OK TO WEAR. DO NOT SHAVE FOR 48 HOURS PRIOR TO DAY OF SURGERY.  CONTACTS, GLASSES, OR DENTURES MAY NOT BE WORN TO SURGERY.  REMEMBER: NO SMOKING, VAPING ,  DRUGS OR ALCOHOL FOR 24 HOURS BEFORE YOUR SURGERY.                                    [facility] IS NOT RESPONSIBLE  FOR ANY BELONGINGS.                                   ________________________________________________________________________  QUESTIONS CALL SHARON  PRE OP NURSE PHONE [phone].

## 2023-03-09 NOTE — Progress Notes (Addendum)
Spoke w/ via phone for pre-op interview--- pt, informed of facility change, which she was not aware Lab needs dos----  no            Lab results------ pt as PAT lab appt for 03-12-2023 @ 1145, CBC/ BMP/ EKG and wt check COVID test -----patient states asymptomatic no test needed Arrive at ------- 0530 on 03-13-2023 NPO after MN NO Solid Food.  Clear liquids from MN until--- 0430 Med rec completed Medications to take morning of surgery ----- synthroid Diabetic medication ----- do half dose lantus insulin night before surgery Patient instructed no nail polish to be worn day of surgery Patient instructed to bring photo id and insurance card day of surgery Patient aware to have Driver (ride ) / caregiver for 24 hours after surgery --daughter, Jeanne [name] Patient Special Instructions -----  pt stated and documentation in epic was given instructions to not do her ozempic 03-06-2023 prior to surgery 03-13-2023.  Pt stated her last dose was 02-27-2023. Asked pt to bring both inhalers dos .  Also when at pt's lab appt will pick-up written instrucions.  Pre-Op special Instructions -----  this pt was moved from [facility] to [facility] 03-08-2023, anesthesia had reviewed chart and ok'd to be done @ [facility] prior to being moved  Sent inbox message to dr constant in epic, requested orders.  Patient verbalized understanding of instructions that were given at this phone interview. Patient denies shortness of breath, chest pain, fever, cough at this phone interview.

## 2023-03-12 ENCOUNTER — Encounter (HOSPITAL_COMMUNITY): Admission: RE | Admit: 2023-03-12 | Payer: Medicare (Managed Care) | Source: Ambulatory Visit

## 2023-03-12 ENCOUNTER — Other Ambulatory Visit: Payer: Self-pay

## 2023-03-12 ENCOUNTER — Telehealth: Payer: Self-pay

## 2023-03-12 NOTE — Progress Notes (Signed)
Spoke with pt for pre-op call. Pt was originally scheduled at [facility] for same surgery, but has been moved to [facility]. Nurse at [facility] reviewed medical history. Pt is diabetic. Last A1C was 6.5 a few months ago at [facility]. Pt states her fasting blood sugar is usually around 90 or more. Instructed pt to take 1/2 of her normal dose of Lantus tonight. She will take 15 units.   Shower instructions given to pt .

## 2023-03-12 NOTE — Telephone Encounter (Signed)
Called patient to inform her that we had to update her surgery time and location. Procedure date remains on 03/13/23. Location is at [facility] at 1:30 pm and pt must arrive by 11:30 am. Patient understood.

## 2023-03-13 ENCOUNTER — Encounter (HOSPITAL_COMMUNITY): Payer: Self-pay | Admitting: Obstetrics and Gynecology

## 2023-03-13 ENCOUNTER — Encounter (HOSPITAL_COMMUNITY)
Admission: RE | Disposition: A | Discharge status: Home / Self Care | Payer: Self-pay | Source: Home / Self Care | Attending: Obstetrics and Gynecology

## 2023-03-13 ENCOUNTER — Ambulatory Visit (HOSPITAL_COMMUNITY): Payer: Self-pay | Admitting: Anesthesiology

## 2023-03-13 ENCOUNTER — Ambulatory Visit (HOSPITAL_COMMUNITY)
Admission: RE | Admit: 2023-03-13 | Discharge: 2023-03-13 | Disposition: A | Discharge status: Home / Self Care | Payer: Medicare (Managed Care) | Attending: Obstetrics and Gynecology | Admitting: Obstetrics and Gynecology

## 2023-03-13 ENCOUNTER — Ambulatory Visit (HOSPITAL_BASED_OUTPATIENT_CLINIC_OR_DEPARTMENT_OTHER): Payer: Medicare (Managed Care) | Admitting: Anesthesiology

## 2023-03-13 ENCOUNTER — Other Ambulatory Visit: Payer: Self-pay

## 2023-03-13 DIAGNOSIS — Z6841 Body Mass Index (BMI) 40.0 and over, adult: Secondary | ICD-10-CM | POA: Insufficient documentation

## 2023-03-13 DIAGNOSIS — E039 Hypothyroidism, unspecified: Secondary | ICD-10-CM | POA: Diagnosis not present

## 2023-03-13 DIAGNOSIS — I10 Essential (primary) hypertension: Secondary | ICD-10-CM | POA: Diagnosis not present

## 2023-03-13 DIAGNOSIS — Z794 Long term (current) use of insulin: Secondary | ICD-10-CM | POA: Insufficient documentation

## 2023-03-13 DIAGNOSIS — N840 Polyp of corpus uteri: Secondary | ICD-10-CM | POA: Diagnosis not present

## 2023-03-13 DIAGNOSIS — E119 Type 2 diabetes mellitus without complications: Secondary | ICD-10-CM | POA: Insufficient documentation

## 2023-03-13 DIAGNOSIS — Z01812 Encounter for preprocedural laboratory examination: Secondary | ICD-10-CM

## 2023-03-13 DIAGNOSIS — Z01818 Encounter for other preprocedural examination: Secondary | ICD-10-CM

## 2023-03-13 DIAGNOSIS — F17210 Nicotine dependence, cigarettes, uncomplicated: Secondary | ICD-10-CM

## 2023-03-13 DIAGNOSIS — Z79890 Hormone replacement therapy (postmenopausal): Secondary | ICD-10-CM | POA: Insufficient documentation

## 2023-03-13 DIAGNOSIS — E785 Hyperlipidemia, unspecified: Secondary | ICD-10-CM | POA: Diagnosis not present

## 2023-03-13 DIAGNOSIS — N950 Postmenopausal bleeding: Secondary | ICD-10-CM

## 2023-03-13 DIAGNOSIS — J449 Chronic obstructive pulmonary disease, unspecified: Secondary | ICD-10-CM | POA: Insufficient documentation

## 2023-03-13 DIAGNOSIS — Z7985 Long-term (current) use of injectable non-insulin antidiabetic drugs: Secondary | ICD-10-CM | POA: Insufficient documentation

## 2023-03-13 DIAGNOSIS — Z7951 Long term (current) use of inhaled steroids: Secondary | ICD-10-CM | POA: Insufficient documentation

## 2023-03-13 HISTORY — DX: Body Mass Index (BMI) 40.0 and over, adult: Z684

## 2023-03-13 HISTORY — DX: Leiomyoma of uterus, unspecified: D25.9

## 2023-03-13 HISTORY — DX: Other intervertebral disc degeneration, lumbar region: M51.36

## 2023-03-13 HISTORY — DX: Presence of spectacles and contact lenses: Z97.3

## 2023-03-13 HISTORY — DX: Personal history of other diseases of the female genital tract: Z87.42

## 2023-03-13 HISTORY — DX: Vitamin D deficiency, unspecified: E55.9

## 2023-03-13 HISTORY — DX: Dependence on other enabling machines and devices: Z99.89

## 2023-03-13 HISTORY — PX: HYSTEROSCOPY WITH D & C: SHX1775

## 2023-03-13 HISTORY — DX: Stress incontinence (female) (male): N39.3

## 2023-03-13 HISTORY — DX: Postmenopausal bleeding: N95.0

## 2023-03-13 HISTORY — DX: Unsteadiness on feet: R26.81

## 2023-03-13 HISTORY — DX: Other intervertebral disc degeneration, lumbar region without mention of lumbar back pain or lower extremity pain: M51.369

## 2023-03-13 HISTORY — DX: Chronic obstructive pulmonary disease, unspecified: J44.9

## 2023-03-13 HISTORY — DX: Long term (current) use of insulin: Z79.4

## 2023-03-13 HISTORY — DX: Other complications of anesthesia, initial encounter: T88.59XA

## 2023-03-13 HISTORY — DX: Type 2 diabetes mellitus without complications: E11.9

## 2023-03-13 HISTORY — DX: Hypothyroidism, unspecified: E03.9

## 2023-03-13 HISTORY — DX: Polyp of corpus uteri: N84.0

## 2023-03-13 LAB — BASIC METABOLIC PANEL
Anion gap: 9 (ref 5–15)
BUN: 13 mg/dL (ref 8–23)
CO2: 22 mmol/L (ref 22–32)
Calcium: 9.3 mg/dL (ref 8.9–10.3)
Chloride: 105 mmol/L (ref 98–111)
Creatinine, Ser: 0.84 mg/dL (ref 0.44–1.00)
GFR, Estimated: >60 mL/min (ref >60)
Glucose, Bld: 163 mg/dL — ABNORMAL HIGH (ref 70–99)
Potassium: 3.9 mmol/L (ref 3.5–5.1)
Sodium: 136 mmol/L (ref 135–145)

## 2023-03-13 LAB — CBC
HCT: 42.8 % (ref 36.0–46.0)
Hemoglobin: 13.8 g/dL (ref 12.0–15.0)
MCH: 30.5 pg (ref 26.0–34.0)
MCHC: 32.2 g/dL (ref 30.0–36.0)
MCV: 94.7 fL (ref 80.0–100.0)
Platelets: 171 10*3/uL (ref 150–400)
RBC: 4.52 MIL/uL (ref 3.87–5.11)
RDW: 12.9 % (ref 11.5–15.5)
WBC: 4 10*3/uL (ref 4.0–10.5)
nRBC: 0 % (ref 0.0–0.2)

## 2023-03-13 LAB — TYPE AND SCREEN
ABO/RH(D): O POS
Antibody Screen: NEGATIVE

## 2023-03-13 LAB — GLUCOSE, CAPILLARY
Glucose-Capillary: 163 mg/dL — ABNORMAL HIGH (ref 70–99)
Glucose-Capillary: 133 mg/dL — ABNORMAL HIGH (ref 70–99)

## 2023-03-13 LAB — ABO/RH: ABO/RH(D): O POS

## 2023-03-13 SURGERY — DILATATION AND CURETTAGE /HYSTEROSCOPY
Anesthesia: General

## 2023-03-13 MED ORDER — IBUPROFEN 800 MG PO TABS: 800.0000 mg | ORAL_TABLET | Freq: Three times a day (TID) | ORAL | 3 refills | Status: AC | PRN

## 2023-03-13 MED ORDER — SUCCINYLCHOLINE CHLORIDE 200 MG/10ML IV SOSY
PREFILLED_SYRINGE | INTRAVENOUS | Status: AC
  Filled 2023-03-13: qty 10

## 2023-03-13 MED ORDER — FENTANYL CITRATE (PF) 100 MCG/2ML IJ SOLN: 25.0000 ug | INTRAMUSCULAR | Status: DC | PRN

## 2023-03-13 MED ORDER — OXYCODONE HCL 5 MG PO TABS
5.0000 mg | ORAL_TABLET | Freq: Once | ORAL | Status: AC | PRN
  Administered 2023-03-13: 5 mg via ORAL

## 2023-03-13 MED ORDER — PHENYLEPHRINE 80 MCG/ML (10ML) SYRINGE FOR IV PUSH (FOR BLOOD PRESSURE SUPPORT)
PREFILLED_SYRINGE | INTRAVENOUS | Status: DC | PRN
  Administered 2023-03-13 (×3): 160 ug via INTRAVENOUS

## 2023-03-13 MED ORDER — ACETAMINOPHEN 10 MG/ML IV SOLN: 1000.0000 mg | Freq: Once | INTRAVENOUS | Status: DC | PRN

## 2023-03-13 MED ORDER — CHLORHEXIDINE GLUCONATE 0.12 % MT SOLN
OROMUCOSAL | Status: AC
  Administered 2023-03-13: 15 mL via OROMUCOSAL
  Filled 2023-03-13: qty 15

## 2023-03-13 MED ORDER — CEFAZOLIN SODIUM-DEXTROSE 2-3 GM-%(50ML) IV SOLR
INTRAVENOUS | Status: DC | PRN
  Administered 2023-03-13: 2 g via INTRAVENOUS

## 2023-03-13 MED ORDER — PROPOFOL 10 MG/ML IV BOLUS
INTRAVENOUS | Status: AC
  Filled 2023-03-13: qty 20

## 2023-03-13 MED ORDER — ONDANSETRON HCL 4 MG/2ML IJ SOLN
INTRAMUSCULAR | Status: AC
  Filled 2023-03-13: qty 4

## 2023-03-13 MED ORDER — CHLOROPROCAINE HCL 1 % IJ SOLN: INTRAMUSCULAR | Status: DC | PRN

## 2023-03-13 MED ORDER — FENTANYL CITRATE (PF) 250 MCG/5ML IJ SOLN
INTRAMUSCULAR | Status: AC
  Filled 2023-03-13: qty 5

## 2023-03-13 MED ORDER — KETOROLAC TROMETHAMINE 30 MG/ML IJ SOLN
INTRAMUSCULAR | Status: AC
  Filled 2023-03-13: qty 1

## 2023-03-13 MED ORDER — ALBUTEROL SULFATE HFA 108 (90 BASE) MCG/ACT IN AERS
INHALATION_SPRAY | RESPIRATORY_TRACT | Status: DC | PRN
  Administered 2023-03-13: 4 via RESPIRATORY_TRACT

## 2023-03-13 MED ORDER — PHENYLEPHRINE 80 MCG/ML (10ML) SYRINGE FOR IV PUSH (FOR BLOOD PRESSURE SUPPORT)
PREFILLED_SYRINGE | INTRAVENOUS | Status: AC
  Filled 2023-03-13: qty 20

## 2023-03-13 MED ORDER — ACETAMINOPHEN 500 MG PO TABS
ORAL_TABLET | ORAL | Status: AC
  Administered 2023-03-13: 1000 mg via ORAL
  Filled 2023-03-13: qty 2

## 2023-03-13 MED ORDER — LIDOCAINE 2% (20 MG/ML) 5 ML SYRINGE
INTRAMUSCULAR | Status: DC | PRN
  Administered 2023-03-13: 60 mg via INTRAVENOUS

## 2023-03-13 MED ORDER — PROPOFOL 10 MG/ML IV BOLUS
INTRAVENOUS | Status: DC | PRN
  Administered 2023-03-13: 200 mg via INTRAVENOUS

## 2023-03-13 MED ORDER — DEXAMETHASONE SODIUM PHOSPHATE 10 MG/ML IJ SOLN
INTRAMUSCULAR | Status: DC | PRN
  Administered 2023-03-13: 10 mg via INTRAVENOUS

## 2023-03-13 MED ORDER — CHLORHEXIDINE GLUCONATE 0.12 % MT SOLN
15.0000 mL | OROMUCOSAL | Status: AC
  Filled 2023-03-13: qty 15

## 2023-03-13 MED ORDER — INSULIN ASPART 100 UNIT/ML IJ SOLN: 0.0000 [IU] | INTRAMUSCULAR | Status: DC | PRN

## 2023-03-13 MED ORDER — SODIUM CHLORIDE 0.9 % IR SOLN
Status: DC | PRN
  Administered 2023-03-13: 1000 mL
  Administered 2023-03-13: 3000 mL

## 2023-03-13 MED ORDER — AMISULPRIDE (ANTIEMETIC) 5 MG/2ML IV SOLN: 10.0000 mg | Freq: Once | INTRAVENOUS | Status: DC | PRN

## 2023-03-13 MED ORDER — OXYCODONE-ACETAMINOPHEN 5-325 MG PO TABS: 1.0000 | ORAL_TABLET | Freq: Four times a day (QID) | ORAL | 0 refills | Status: AC | PRN

## 2023-03-13 MED ORDER — ONDANSETRON HCL 4 MG/2ML IJ SOLN
INTRAMUSCULAR | Status: DC | PRN
  Administered 2023-03-13: 4 mg via INTRAVENOUS

## 2023-03-13 MED ORDER — SUCCINYLCHOLINE CHLORIDE 200 MG/10ML IV SOSY
PREFILLED_SYRINGE | INTRAVENOUS | Status: DC | PRN
  Administered 2023-03-13: 160 mg via INTRAVENOUS

## 2023-03-13 MED ORDER — CEFAZOLIN SODIUM-DEXTROSE 2-4 GM/100ML-% IV SOLN
INTRAVENOUS | Status: AC
  Filled 2023-03-13: qty 100

## 2023-03-13 MED ORDER — OXYCODONE HCL 5 MG PO TABS
ORAL_TABLET | ORAL | Status: AC
  Filled 2023-03-13: qty 1

## 2023-03-13 MED ORDER — KETOROLAC TROMETHAMINE 30 MG/ML IJ SOLN
INTRAMUSCULAR | Status: DC | PRN
  Administered 2023-03-13: 30 mg via INTRAVENOUS

## 2023-03-13 MED ORDER — POVIDONE-IODINE 10 % EX SWAB: 2.0000 | Freq: Once | CUTANEOUS | Status: DC

## 2023-03-13 MED ORDER — INSULIN ASPART 100 UNIT/ML IJ SOLN
INTRAMUSCULAR | Status: AC
  Administered 2023-03-13: 2 [IU] via SUBCUTANEOUS
  Filled 2023-03-13: qty 1

## 2023-03-13 MED ORDER — DEXAMETHASONE SODIUM PHOSPHATE 10 MG/ML IJ SOLN
INTRAMUSCULAR | Status: AC
  Filled 2023-03-13: qty 2

## 2023-03-13 MED ORDER — FENTANYL CITRATE (PF) 250 MCG/5ML IJ SOLN
INTRAMUSCULAR | Status: DC | PRN
  Administered 2023-03-13: 150 ug via INTRAVENOUS

## 2023-03-13 MED ORDER — ALBUTEROL SULFATE HFA 108 (90 BASE) MCG/ACT IN AERS
INHALATION_SPRAY | RESPIRATORY_TRACT | Status: AC
  Filled 2023-03-13: qty 6.7

## 2023-03-13 MED ORDER — LACTATED RINGERS IV SOLN: INTRAVENOUS | Status: DC

## 2023-03-13 MED ORDER — CHLOROPROCAINE HCL 1 % IJ SOLN
INTRAMUSCULAR | Status: AC
  Filled 2023-03-13: qty 30

## 2023-03-13 MED ORDER — ACETAMINOPHEN 500 MG PO TABS: 1000.0000 mg | ORAL_TABLET | Freq: Once | ORAL | Status: AC

## 2023-03-13 MED ORDER — OXYCODONE HCL 5 MG/5ML PO SOLN: 5.0000 mg | Freq: Once | ORAL | Status: AC | PRN

## 2023-03-13 SURGICAL SUPPLY — 13 items
CATH ROBINSON RED A/P 16FR (CATHETERS) ×1 IMPLANT
DEVICE MYOSURE LITE (MISCELLANEOUS) IMPLANT
GLOVE BIOGEL PI IND STRL 6.5 (GLOVE) ×1 IMPLANT
GLOVE SURG SS PI 6.5 STRL IVOR (GLOVE) ×1 IMPLANT
GLOVE SURG UNDER POLY LF SZ7 (GLOVE) ×1 IMPLANT
GOWN STRL REUS W/ TWL LRG LVL3 (GOWN DISPOSABLE) ×2 IMPLANT
GOWN STRL REUS W/TWL LRG LVL3 (GOWN DISPOSABLE) ×2
KIT PROCEDURE FLUENT (KITS) ×1 IMPLANT
KIT TURNOVER KIT B (KITS) ×1 IMPLANT
PACK VAGINAL MINOR WOMEN LF (CUSTOM PROCEDURE TRAY) ×1 IMPLANT
PAD OB MATERNITY 4.3X12.25 (PERSONAL CARE ITEMS) ×1 IMPLANT
SEAL ROD LENS SCOPE MYOSURE (ABLATOR) IMPLANT
TOWEL GREEN STERILE FF (TOWEL DISPOSABLE) ×2 IMPLANT

## 2023-03-13 NOTE — Op Note (Signed)
PREOPERATIVE DIAGNOSIS:  post menopausal uterine bleeding. POSTOPERATIVE DIAGNOSIS: The same PROCEDURE: Hysteroscopy, Dilation and Curettage with polypectomy SURGEON:  Dr. Catalina Antigua   INDICATIONS: 67 y.o. [id number]  here for scheduled surgery for D&C hysteroscopy, polypectomy for the management of postmenopausal uterine bleeding.   Risks of surgery were discussed with the patient including but not limited to: bleeding which may require transfusion; infection which may require antibiotics; injury to uterus or surrounding organs; intrauterine scarring which may impair future fertility; need for additional procedures including laparotomy or laparoscopy; and other postoperative/anesthesia complications. Written informed consent was obtained.    FINDINGS:  A 12 week size uterus.  Diffuse proliferative endometrium and two approximately 2 cm in length endometrial polyps, one located near fundal region and the other on posterior aspect of lower uterine segment (pictures were taken but not saved).  Normal ostia bilaterally.  ANESTHESIA:   General, paracervical block. INTRAVENOUS FLUIDS:  800 ml of LR FLUID DEFICITS:  85 ml of normal saline ESTIMATED BLOOD LOSS:  Less than 20 ml SPECIMENS: Endometrial curettings and fragments of uterine polyp sent to pathology COMPLICATIONS:  None immediate.  PROCEDURE DETAILS:  The patient received intravenous antibiotics while in the preoperative area.  She was then taken to the operating room where general anesthesia was administered and was found to be adequate.  After an adequate timeout was performed, she was placed in the dorsal lithotomy position and examined; then prepped and draped in the sterile manner.   Her bladder was catheterized for an unmeasured amount of clear, yellow urine. A speculum was then placed in the patient's vagina and a single tooth tenaculum was applied to the anterior lip of the cervix.   The cervix was sounded to 10 cm and dilated manually  with Hagar dilators to accommodate the 5 mm diagnostic hysteroscope.  Once the cervix was dilated, the hysteroscope was inserted under direct visualization using normal saline as a suspension medium.  The uterine cavity was carefully examined, both ostia were recognized, along with above mentioned findings.   The myosure device was then inserted into the endometrial cavity and polypectomy was performed under direct visualization without any complications. After further careful visualization of the uterine cavity, the hysteroscope was removed under direct visualization.  A sharp curettage was then performed to obtain a moderate amount of endometrial curettings.  The tenaculum was removed from the anterior lip of the cervix and the vaginal speculum was removed after noting good hemostasis.  The patient tolerated the procedure well and was taken to the recovery area awake, extubated and in stable condition.

## 2023-03-13 NOTE — Anesthesia Procedure Notes (Signed)
Procedure Name: Intubation Date/Time: 03/13/2023 1:02 PM  Performed by: Alease Medina, CRNAPre-anesthesia Checklist: Patient identified, Emergency Drugs available, Suction available and Patient being monitored Patient Re-evaluated:Patient Re-evaluated prior to induction Oxygen Delivery Method: Circle system utilized Preoxygenation: Pre-oxygenation with 100% oxygen Induction Type: IV induction Ventilation: Mask ventilation without difficulty Laryngoscope Size: Mac and 3 Grade View: Grade I Tube type: Oral Tube size: 7.0 mm Number of attempts: 1 Airway Equipment and Method: Stylet Placement Confirmation: ETT inserted through vocal cords under direct vision, positive ETCO2 and breath sounds checked- equal and bilateral Secured at: 21 cm Tube secured with: Tape Dental Injury: Teeth and Oropharynx as per pre-operative assessment

## 2023-03-13 NOTE — Anesthesia Postprocedure Evaluation (Signed)
Anesthesia Post Note  Patient: Jeanne [name]  Procedure(s) Performed: DILATATION AND CURETTAGE /HYSTEROSCOPY     Patient location during evaluation: PACU Anesthesia Type: General Level of consciousness: awake Pain management: pain level controlled Vital Signs Assessment: post-procedure vital signs reviewed and stable Respiratory status: spontaneous breathing, nonlabored ventilation and respiratory function stable Cardiovascular status: blood pressure returned to baseline and stable Postop Assessment: no apparent nausea or vomiting Anesthetic complications: no   No notable events documented.  Last Vitals:  Vitals:   03/13/23 1423 03/13/23 1430  BP:  119/74  Pulse: 83 81  Resp: 15 16  Temp:  36.4 C  SpO2: 94% 95%    Last Pain:  Vitals:   03/13/23 1423  PainSc: 3                  Kayslee Furey P Bryttany Tortorelli

## 2023-03-13 NOTE — Transfer of Care (Signed)
Immediate Anesthesia Transfer of Care Note  Patient: Jeanne [name]  Procedure(s) Performed: DILATATION AND CURETTAGE /HYSTEROSCOPY  Patient Location: PACU  Anesthesia Type:General  Level of Consciousness: drowsy  Airway & Oxygen Therapy: Patient Spontanous Breathing and Patient connected to face mask oxygen  Post-op Assessment: Report given to RN, Post -op Vital signs reviewed and stable, and Patient moving all extremities X 4  Post vital signs: Reviewed and stable  Last Vitals:  Vitals Value Taken Time  BP 118/67   Temp    Pulse 88   Resp 14   SpO2 93     Last Pain:  Vitals:   03/13/23 1156  PainSc: 0-No pain      Patients Stated Pain Goal: 0 (03/13/23 1156)  Complications: No notable events documented.

## 2023-03-13 NOTE — Anesthesia Preprocedure Evaluation (Addendum)
Anesthesia Evaluation  Patient identified by MRN, date of birth, ID band Patient awake    Reviewed: Allergy & Precautions, NPO status , Patient's Chart, lab work & pertinent test results  History of Anesthesia Complications Negative for: history of anesthetic complications  Airway Mallampati: I  TM Distance: >3 FB Neck ROM: Full    Dental  (+) Missing, Chipped, Edentulous Upper, Partial Lower,    Pulmonary Current Smoker and Patient abstained from smoking.   Pulmonary exam normal        Cardiovascular hypertension, Pt. on medications Normal cardiovascular exam     Neuro/Psych negative neurological ROS  negative psych ROS   GI/Hepatic negative GI ROS, Neg liver ROS,,,  Endo/Other  diabetes, Type 2, Insulin DependentHypothyroidism  Morbid obesity  Renal/GU negative Renal ROS  negative genitourinary   Musculoskeletal  (+) Arthritis ,    Abdominal   Peds  Hematology negative hematology ROS (+)   Anesthesia Other Findings   Reproductive/Obstetrics                             Anesthesia Physical Anesthesia Plan  ASA: 3  Anesthesia Plan: General   Post-op Pain Management: Tylenol PO (pre-op)* and Toradol IV (intra-op)*   Induction: Intravenous  PONV Risk Score and Plan: 3 and Treatment may vary due to age or medical condition, Ondansetron and Dexamethasone  Airway Management Planned: LMA and Oral ETT  Additional Equipment:   Intra-op Plan:   Post-operative Plan: Extubation in OR  Informed Consent: I have reviewed the patients History and Physical, chart, labs and discussed the procedure including the risks, benefits and alternatives for the proposed anesthesia with the patient or authorized representative who has indicated his/her understanding and acceptance.     Dental advisory given  Plan Discussed with: Anesthesiologist and CRNA  Anesthesia Plan Comments:          Anesthesia Quick Evaluation

## 2023-03-13 NOTE — H&P (Signed)
Jeanne [name] is an 67 y.o. female P2 postmenopausal here for scheduled dilatation and currettage for the evaluation of postmenopausal vaginal bleeding.  Patient was seen in January 2024 after experiencing vaginal spotting in November. She continues to notice intermittent vaginal spotting when she uses the restroom. Office endometrial biopsy revealed fragments of benign endometrial polyp. Patient is without any other complaints.   Pertinent Gynecological History: Menses: post-menopausal Bleeding: post menopausal bleeding Contraception: none DES exposure: denies Last mammogram: normal Date: 10/2022 Last pap: normal Date: 08/2022 OB History: P2002   Menstrual History: No LMP recorded. Patient is postmenopausal.    Past Medical History:  Diagnosis Date   Arthritis    BMI 50.0-59.9, adult (HCC)    COPD (chronic obstructive pulmonary disease) (HCC)    DDD (degenerative disc disease), lumbar    Endometrial polyp    Gait instability    uses rollator   History of abnormal cervical Pap smear    Hyperlipidemia    Hypertension    Hypothyroidism    Insulin dependent type 2 diabetes mellitus (HCC)    followed by pcp   (03-09-2023  per pt checking blood surgery 2-3 times daily,  fasting average 125-138)   PMB (postmenopausal bleeding)    SUI (stress urinary incontinence, female)    Uses roller walker    Uterine fibroid    Vitamin D deficiency    Wears glasses     Past Surgical History:  Procedure Laterality Date   CATARACT EXTRACTION W/ INTRAOCULAR LENS IMPLANT Bilateral 09/2019   CESAREAN SECTION  04/19/1989   COLONOSCOPY WITH PROPOFOL N/A 02/15/2022   Procedure: COLONOSCOPY WITH PROPOFOL;  Surgeon: Willis Modena, MD;  Location: [facility];  Service: Gastroenterology;  Laterality: N/A;    Family History  Problem Relation Age of Onset   Hypertension Maternal Grandmother    Alcohol abuse Maternal Grandmother    Diabetes Mother    Heart disease Mother    Hypertension Mother     Alcohol abuse Mother    Heart disease Sister    Alcohol abuse Brother    Alcohol abuse Sister    Hypertension Maternal Aunt    Breast cancer Neg Hx     Social History:  reports that she has been smoking cigarettes. She started smoking about 52 years ago. She has a 93.6 pack-year smoking history. She has never used smokeless tobacco. She reports that she does not drink alcohol and does not use drugs.  Allergies: No Known Allergies  Medications Prior to Admission  Medication Sig Dispense Refill Last Dose   acetaminophen (TYLENOL) 650 MG CR tablet Take 1,300 mg by mouth every 8 (eight) hours as needed for pain.   Past Week   albuterol (VENTOLIN HFA) 108 (90 Base) MCG/ACT inhaler Inhale 2 puffs into the lungs every 6 (six) hours as needed for shortness of breath or wheezing.   Past Week   Cholecalciferol (VITAMIN D3 MAXIMUM STRENGTH) 125 MCG (5000 UT) capsule Take 10,000 Units by mouth daily.   Past Week   cyclobenzaprine (FLEXERIL) 10 MG tablet Take 10 mg by mouth at bedtime as needed for muscle spasms.   Past Week   diclofenac (VOLTAREN) 75 MG EC tablet Take 75 mg by mouth 2 (two) times daily.   Past Week   Docusate Calcium (STOOL SOFTENER PO) Take 2 tablets by mouth daily as needed (constipation).   03/12/2023   fluticasone-salmeterol (ADVAIR) 100-50 MCG/ACT AEPB Inhale 1 puff into the lungs at bedtime.   03/12/2023   insulin glargine (  LANTUS SOLOSTAR) 100 UNIT/ML Solostar Pen Inject 30 Units into the skin at bedtime.   03/12/2023   levothyroxine (SYNTHROID) 50 MCG tablet Take 50 mcg by mouth daily before breakfast.   03/13/2023   lisinopril-hydrochlorothiazide (PRINZIDE,ZESTORETIC) 20-12.5 MG per tablet Take 1 tablet by mouth daily.   03/12/2023   pravastatin (PRAVACHOL) 20 MG tablet Take 20 mg by mouth at bedtime.   03/12/2023   Semaglutide,0.25 or 0.5MG /DOS, (OZEMPIC, 0.25 OR 0.5 MG/DOSE,) 2 MG/3ML SOPN Inject 0.5 mg into the skin every Tuesday.   02/27/2023    Review of Systems See  pertinent in HPI. All other systems reviewed and non contributory Blood pressure (!) 116/59, pulse 92, temperature 98.1 F (36.7 C), resp. rate 20, height 5\' 8"  (1.727 m), weight (!) 155.6 kg, SpO2 97%. Physical Exam GENERAL: Well-developed, well-nourished female in no acute distress.  LUNGS: Clear to auscultation bilaterally.  HEART: Regular rate and rhythm. ABDOMEN: Soft, nontender, nondistended. No organomegaly. PELVIC: Deferred to OR EXTREMITIES: No cyanosis, clubbing, or edema, 2+ distal pulses.  Results for orders placed or performed during the hospital encounter of 03/13/23 (from the past 24 hour(s))  Glucose, capillary     Status: Abnormal   Collection Time: 03/13/23 11:28 AM  Result Value Ref Range   Glucose-Capillary 163 (H) 70 - 99 mg/dL    No results found.  Assessment/Plan: 67 yo P2 with postmenopausal vaginal bleeding and endometrial biopsy suggestive of a polyp here for D&C hysteroscopy with polypectomy - risks, benefits and alternatives were explained including but not limited to risks of bleeding, infection, uterine perforation and damage to adjacent organs. Patient verbalized understanding and all questions were answered   Normal Recinos 03/13/2023, 12:20 PM

## 2023-03-14 ENCOUNTER — Encounter (HOSPITAL_COMMUNITY): Payer: Self-pay | Admitting: Obstetrics and Gynecology

## 2023-04-09 ENCOUNTER — Encounter: Payer: Self-pay | Admitting: Obstetrics and Gynecology

## 2023-04-09 ENCOUNTER — Ambulatory Visit (INDEPENDENT_AMBULATORY_CARE_PROVIDER_SITE_OTHER): Payer: Medicare (Managed Care) | Admitting: Obstetrics and Gynecology

## 2023-04-09 VITALS — BP 114/73 | HR 89 | Wt 340.0 lb

## 2023-04-09 DIAGNOSIS — Z30430 Encounter for insertion of intrauterine contraceptive device: Secondary | ICD-10-CM | POA: Diagnosis not present

## 2023-04-09 DIAGNOSIS — Z9889 Other specified postprocedural states: Secondary | ICD-10-CM | POA: Diagnosis not present

## 2023-04-09 MED ORDER — LEVONORGESTREL 20 MCG/DAY IU IUD
1.0000 | INTRAUTERINE_SYSTEM | Freq: Once | INTRAUTERINE | Status: AC
Start: 2023-04-09 — End: 2023-04-09
  Administered 2023-04-09: 1 via INTRAUTERINE

## 2023-04-09 NOTE — Progress Notes (Signed)
67 yo P2 here for post op check and IUD insertion. Patient had D&C polypectomy on 7/30 due to postmenopausal vaginal bleeding. She reports doing well early on with some vaginal spotting immediately following her procedure. She denies any further episodes of vaginal bleeding. She has returned to her regular activities  Past Medical History:  Diagnosis Date   Arthritis    BMI 50.0-59.9, adult (HCC)    COPD (chronic obstructive pulmonary disease) (HCC)    DDD (degenerative disc disease), lumbar    Endometrial polyp    Gait instability    uses rollator   History of abnormal cervical Pap smear    Hyperlipidemia    Hypertension    Hypothyroidism    Insulin dependent type 2 diabetes mellitus (HCC)    followed by pcp   (03-09-2023  per pt checking blood surgery 2-3 times daily,  fasting average 125-138)   PMB (postmenopausal bleeding)    SUI (stress urinary incontinence, female)    Uses roller walker    Uterine fibroid    Vitamin D deficiency    Wears glasses    Past Surgical History:  Procedure Laterality Date   CATARACT EXTRACTION W/ INTRAOCULAR LENS IMPLANT Bilateral 09/2019   CESAREAN SECTION  04/19/1989   COLONOSCOPY WITH PROPOFOL N/A 02/15/2022   Procedure: COLONOSCOPY WITH PROPOFOL;  Surgeon: Willis Modena, MD;  Location: [facility];  Service: Gastroenterology;  Laterality: N/A;   HYSTEROSCOPY WITH D & C N/A 03/13/2023   Procedure: DILATATION AND CURETTAGE /HYSTEROSCOPY;  Surgeon: Catalina Antigua, MD;  Location: [facility] OR;  Service: Gynecology;  Laterality: N/A;   Family History  Problem Relation Age of Onset   Hypertension Maternal Grandmother    Alcohol abuse Maternal Grandmother    Diabetes Mother    Heart disease Mother    Hypertension Mother    Alcohol abuse Mother    Heart disease Sister    Alcohol abuse Brother    Alcohol abuse Sister    Hypertension Maternal Aunt    Breast cancer Neg Hx    Social History   Tobacco Use   Smoking status: Every Day    Current  packs/day: 1.00    Average packs/day: 1 pack/day for 93.7 years (93.7 ttl pk-yrs)    Types: Cigarettes    Start date: 23   Smokeless tobacco: Never   Tobacco comments:    03-09-2023  per pt started smoking age 25 ,  smokes 1 ppd  Vaping Use   Vaping status: Never Used  Substance Use Topics   Alcohol use: No   Drug use: Never   ROS See pertinent in HPI. All other systems reviewed and non contributory Blood pressure 114/73, pulse 89, weight (!) 340 lb (154.2 kg). GENERAL: Well-developed, well-nourished female in no acute distress.  ABDOMEN: Soft, nontender, nondistended. No organomegaly. PELVIC: Normal external female genitalia. Vagina is pink and rugated.  Normal discharge. Normal appearing cervix. Uterus is normal in size. No adnexal mass or tenderness. Chaperone present during the pelvic exam EXTREMITIES: No cyanosis, clubbing, or edema, 2+ distal pulses.  A/P 67 yo here for post of check and IUD insertion - Patient is medically cleared to resume all activities of daily living - Pathology results reviewed with the patient - Patient at high risk for endometrial ca due to BMI. Dicussed benefits of prophylactic IUD placement which patient agreed to -IUD Procedure Note Patient identified, informed consent performed, signed copy in chart, time out was performed.  Urine pregnancy test negative.  Speculum placed in the  vagina.  Cervix visualized.  Cleaned with Betadine x 2.  Grasped anteriorly with a single tooth tenaculum.  Uterus sounded to 11 cm.  Mirena IUD placed per manufacturer's recommendations.  Strings trimmed to 3 cm. Tenaculum was removed, good hemostasis noted.  Patient tolerated procedure well.   Patient given post procedure instructions and Mirena care card with expiration date.  Patient is asked to check IUD strings periodically and follow up in 4-6 weeks for IUD check.

## 2023-04-09 NOTE — Progress Notes (Signed)
Pt is in office for post op D&C.  Pt is doing well, has had one episode spotting since.  Denies pain, cramping.

## 2023-05-01 ENCOUNTER — Encounter: Payer: Self-pay | Admitting: Obstetrics and Gynecology

## 2023-05-01 ENCOUNTER — Ambulatory Visit (INDEPENDENT_AMBULATORY_CARE_PROVIDER_SITE_OTHER): Payer: Medicare (Managed Care) | Admitting: Obstetrics and Gynecology

## 2023-05-01 VITALS — BP 120/78 | HR 88 | Ht 68.0 in | Wt 340.0 lb

## 2023-05-01 DIAGNOSIS — Z30431 Encounter for routine checking of intrauterine contraceptive device: Secondary | ICD-10-CM | POA: Diagnosis not present

## 2023-05-01 NOTE — Progress Notes (Signed)
67 yo returning for IUD check. Patient reports feeling well with resolution of cramping pain and reports persistent vaginal spotting. She is without any other complaints.   Past Medical History:  Diagnosis Date   Arthritis    BMI 50.0-59.9, adult (HCC)    COPD (chronic obstructive pulmonary disease) (HCC)    DDD (degenerative disc disease), lumbar    Endometrial polyp    Gait instability    uses rollator   History of abnormal cervical Pap smear    Hyperlipidemia    Hypertension    Hypothyroidism    Insulin dependent type 2 diabetes mellitus (HCC)    followed by pcp   (03-09-2023  per pt checking blood surgery 2-3 times daily,  fasting average 125-138)   PMB (postmenopausal bleeding)    SUI (stress urinary incontinence, female)    Uses roller walker    Uterine fibroid    Vitamin D deficiency    Wears glasses    Past Surgical History:  Procedure Laterality Date   CATARACT EXTRACTION W/ INTRAOCULAR LENS IMPLANT Bilateral 09/2019   CESAREAN SECTION  04/19/1989   COLONOSCOPY WITH PROPOFOL N/A 02/15/2022   Procedure: COLONOSCOPY WITH PROPOFOL;  Surgeon: Willis Modena, MD;  Location: [facility];  Service: Gastroenterology;  Laterality: N/A;   HYSTEROSCOPY WITH D & C N/A 03/13/2023   Procedure: DILATATION AND CURETTAGE /HYSTEROSCOPY;  Surgeon: Catalina Antigua, MD;  Location: [facility];  Service: Gynecology;  Laterality: N/A;   Family History  Problem Relation Age of Onset   Hypertension Maternal Grandmother    Alcohol abuse Maternal Grandmother    Diabetes Mother    Heart disease Mother    Hypertension Mother    Alcohol abuse Mother    Heart disease Sister    Alcohol abuse Brother    Alcohol abuse Sister    Hypertension Maternal Aunt    Breast cancer Neg Hx    Social History   Tobacco Use   Smoking status: Every Day    Current packs/day: 1.00    Average packs/day: 1 pack/day for 93.7 years (93.7 ttl pk-yrs)    Types: Cigarettes    Start date: 76   Smokeless tobacco:  Never   Tobacco comments:    03-09-2023  per pt started smoking age 62 ,  smokes 1 ppd  Vaping Use   Vaping status: Never Used  Substance Use Topics   Alcohol use: No   Drug use: Never   ROS See pertinent in HPI. All other systems reviewed and non contributory Blood pressure 120/78, pulse 88, height 5\' 8"  (1.727 m), weight (!) 340 lb (154.2 kg). GENERAL: Well-developed, well-nourished female in no acute distress.  PELVIC: Normal external female genitalia. Vagina is pink and rugated.  Small amount of blood in the vault. Normal appearing cervix with IUD strings visualized at the os. Uterus is normal in size. No adnexal mass or tenderness. Chaperone present during the pelvic exam EXTREMITIES: No cyanosis, clubbing, or edema, 2+ distal pulses.  A/P 67 yo here for IUD check - IUD appears to be in the appropriate location - Informed patient that irregular bleeding may be present for 3-6 months as body adjusts to hormone - RTC prn

## 2023-10-30 ENCOUNTER — Other Ambulatory Visit: Payer: Self-pay | Admitting: Obstetrics and Gynecology

## 2023-11-06 ENCOUNTER — Other Ambulatory Visit: Payer: Self-pay | Admitting: Registered Nurse

## 2023-11-06 DIAGNOSIS — Z1231 Encounter for screening mammogram for malignant neoplasm of breast: Secondary | ICD-10-CM

## 2023-11-28 ENCOUNTER — Ambulatory Visit
Admission: RE | Admit: 2023-11-28 | Discharge: 2023-11-28 | Disposition: A | Discharge status: Home / Self Care | Payer: Medicare (Managed Care) | Source: Ambulatory Visit | Attending: Registered Nurse | Admitting: Registered Nurse

## 2023-11-28 DIAGNOSIS — Z1231 Encounter for screening mammogram for malignant neoplasm of breast: Secondary | ICD-10-CM

## 2024-02-26 ENCOUNTER — Other Ambulatory Visit (INDEPENDENT_AMBULATORY_CARE_PROVIDER_SITE_OTHER): Payer: Medicare (Managed Care)

## 2024-02-26 ENCOUNTER — Ambulatory Visit (INDEPENDENT_AMBULATORY_CARE_PROVIDER_SITE_OTHER): Payer: Medicare (Managed Care) | Admitting: Orthopaedic Surgery

## 2024-02-26 VITALS — Ht 68.5 in | Wt 331.0 lb

## 2024-02-26 DIAGNOSIS — G8929 Other chronic pain: Secondary | ICD-10-CM

## 2024-02-26 DIAGNOSIS — Z6841 Body Mass Index (BMI) 40.0 and over, adult: Secondary | ICD-10-CM | POA: Diagnosis not present

## 2024-02-26 DIAGNOSIS — M1711 Unilateral primary osteoarthritis, right knee: Secondary | ICD-10-CM | POA: Diagnosis not present

## 2024-02-26 DIAGNOSIS — M25561 Pain in right knee: Secondary | ICD-10-CM | POA: Diagnosis not present

## 2024-02-26 MED ORDER — LIDOCAINE HCL 1 % IJ SOLN
2.0000 mL | INTRAMUSCULAR | Status: AC | PRN
Start: 2024-02-26 — End: 2024-02-26
  Administered 2024-02-26: 2 mL

## 2024-02-26 MED ORDER — BUPIVACAINE HCL 0.5 % IJ SOLN
2.0000 mL | INTRAMUSCULAR | Status: AC | PRN
Start: 2024-02-26 — End: 2024-02-26
  Administered 2024-02-26: 2 mL via INTRA_ARTICULAR

## 2024-02-26 MED ORDER — METHYLPREDNISOLONE ACETATE 40 MG/ML IJ SUSP
40.0000 mg | INTRAMUSCULAR | Status: AC | PRN
  Administered 2024-02-26: 40 mg via INTRA_ARTICULAR

## 2024-02-26 NOTE — Progress Notes (Signed)
 Office Visit Note   Patient: Jeanne [name]           Date of Birth: 03-Apr-1956           MRN: [id number] Visit Date: 02/26/2024              Requested by: Leontine Cramp, NP [address] [place],  [place] [address] PCP: Leontine Cramp, NP   Assessment & Plan: Visit Diagnoses:  1. Chronic pain of right knee   2. Primary osteoarthritis of right knee   3. Body mass index 45.0-49.9, adult (HCC)     Plan: History of Present Illness He presents with chronic right knee pain and stiffness.  He has experienced right knee pain and stiffness for two to three years, with symptoms progressively worsening. He cannot fully extend the right leg, especially when sitting, due to a pulling sensation in the back of the knee. In the last six to eight months, a protruding bone on the knee has been noted. Pain is intermittent, described as 'white noise', particularly at night, affecting sleep. Pain localizes to a specific area on the knee, easing after 15 to 20 seconds before recurring. He received gel injections about a year and a half ago at [facility], administered weekly over three weeks. His activity level is minimal due to knee pain, and he is mostly sedentary except for attending church on Sundays.  Physical Exam MEASUREMENTS: Height- 5'8.5, Weight- 331.  Examination right knee shows medial joint line tenderness.  Pain and crepitus throughout range of motion.  Collaterals and cruciates are stable.  Assessment and Plan Severe knee osteoarthritis Chronic right knee osteoarthritis with degenerative changes and contractures limiting extension. Not a candidate for knee replacement due to weight. - Administer cortisone injection to the right knee for pain relief.  Obesity Weight precludes knee replacement surgery. Discussed need for significant weight loss to become eligible for surgery. Currently using Ozempic. - Encourage continued weight loss efforts with a focus on dietary changes and portion  control.  Follow-Up Instructions: No follow-ups on file.   Orders:  Orders Placed This Encounter  Procedures   XR KNEE 3 VIEW RIGHT   No orders of the defined types were placed in this encounter.     Procedures: Large Joint Inj: R knee on 02/26/2024 10:25 AM Indications: pain Details: 22 G needle  Arthrogram: No  Medications: 40 mg methylPREDNISolone  acetate 40 MG/ML; 2 mL lidocaine  1 %; 2 mL bupivacaine  0.5 % Consent was given by the patient. Patient was prepped and draped in the usual sterile fashion.       Clinical Data: No additional findings.   Subjective: Chief Complaint  Patient presents with   Right Knee - Pain    HPI  Review of Systems  Constitutional: Negative.   HENT: Negative.    Eyes: Negative.   Respiratory: Negative.    Cardiovascular: Negative.   Endocrine: Negative.   Musculoskeletal: Negative.   Neurological: Negative.   Hematological: Negative.   Psychiatric/Behavioral: Negative.    All other systems reviewed and are negative.    Objective: Vital Signs: Ht 5' 8.5 (1.74 m)   Wt (!) 331 lb (150.1 kg)   BMI 49.60 kg/m   Physical Exam Vitals and nursing note reviewed.  Constitutional:      Appearance: She is well-developed.  HENT:     Head: Atraumatic.     Nose: Nose normal.  Eyes:     Extraocular Movements: Extraocular movements intact.  Cardiovascular:  Pulses: Normal pulses.  Pulmonary:     Effort: Pulmonary effort is normal.  Abdominal:     Palpations: Abdomen is soft.  Musculoskeletal:     Cervical back: Neck supple.  Skin:    General: Skin is warm.     Capillary Refill: Capillary refill takes less than 2 seconds.  Neurological:     Mental Status: She is alert. Mental status is at baseline.  Psychiatric:        Behavior: Behavior normal.        Thought Content: Thought content normal.        Judgment: Judgment normal.     Ortho Exam  Specialty Comments:  No specialty comments available.  Imaging: No  results found.   PMFS History: Patient Active Problem List   Diagnosis Date Noted   Abnormal vaginal bleeding with endometrial thickness greater than 5 mm present on transvaginal ultrasound in postmenopausal patient 03/13/2023   Past Medical History:  Diagnosis Date   Arthritis    BMI 50.0-59.9, adult (HCC)    COPD (chronic obstructive pulmonary disease) (HCC)    DDD (degenerative disc disease), lumbar    Endometrial polyp    Gait instability    uses rollator   History of abnormal cervical Pap smear    Hyperlipidemia    Hypertension    Hypothyroidism    Insulin  dependent type 2 diabetes mellitus (HCC)    followed by pcp   (03-09-2023  per pt checking blood surgery 2-3 times daily,  fasting average 125-138)   PMB (postmenopausal bleeding)    SUI (stress urinary incontinence, female)    Uses roller walker    Uterine fibroid    Vitamin D deficiency    Wears glasses     Family History  Problem Relation Age of Onset   Hypertension Maternal Grandmother    Alcohol abuse Maternal Grandmother    Diabetes Mother    Heart disease Mother    Hypertension Mother    Alcohol abuse Mother    Heart disease Sister    Alcohol abuse Brother    Alcohol abuse Sister    Hypertension Maternal Aunt    Breast cancer Neg Hx     Past Surgical History:  Procedure Laterality Date   CATARACT EXTRACTION W/ INTRAOCULAR LENS IMPLANT Bilateral 09/2019   CESAREAN SECTION  04/19/1989   COLONOSCOPY WITH PROPOFOL  N/A 02/15/2022   Procedure: COLONOSCOPY WITH PROPOFOL ;  Surgeon: Burnette Fallow, MD;  Location: [facility];  Service: Gastroenterology;  Laterality: N/A;   HYSTEROSCOPY WITH D & C N/A 03/13/2023   Procedure: DILATATION AND CURETTAGE /HYSTEROSCOPY;  Surgeon: Alger Gong, MD;  Location: [facility];  Service: Gynecology;  Laterality: N/A;   Social History   Occupational History   Not on file  Tobacco Use   Smoking status: Every Day    Current packs/day: 1.00    Average packs/day: 1  pack/day for 94.5 years (94.5 ttl pk-yrs)    Types: Cigarettes    Start date: 48   Smokeless tobacco: Never   Tobacco comments:    03-09-2023  per pt started smoking age 55 ,  smokes 1 ppd  Vaping Use   Vaping status: Never Used  Substance and Sexual Activity   Alcohol use: No   Drug use: Never   Sexual activity: Not Currently    Birth control/protection: None

## 2024-03-05 ENCOUNTER — Other Ambulatory Visit: Payer: Self-pay | Admitting: Physician Assistant

## 2024-03-05 ENCOUNTER — Telehealth: Payer: Self-pay | Admitting: Orthopaedic Surgery

## 2024-03-05 MED ORDER — TRAMADOL HCL 50 MG PO TABS: 50.0000 mg | ORAL_TABLET | Freq: Two times a day (BID) | ORAL | 0 refills | Status: DC | PRN

## 2024-03-05 NOTE — Telephone Encounter (Signed)
 Having intense pain that shoots through. Comes and goes. It was worse yesterday and last night. Today she is doing better. She said that she was having the same symptoms before the injection, but has been hurting more starting yesterday. She wants something for pain. Her PCP gave her a few Tramadol . She said that the Gabapentin has helped in the past.  No fever or chills. Just pain.

## 2024-03-05 NOTE — Telephone Encounter (Signed)
 Sent in tramadol

## 2024-03-05 NOTE — Telephone Encounter (Signed)
 Pt states she started having severe knee pain last night (10 times worse) than before getting the injection on  02/26/24.     Pt's call back [phone]

## 2024-03-05 NOTE — Telephone Encounter (Signed)
 Any fevers, chills or significantly decreased rom?

## 2024-03-05 NOTE — Telephone Encounter (Signed)
 Called and notified patient.

## 2024-04-07 ENCOUNTER — Other Ambulatory Visit: Payer: Self-pay | Admitting: Physician Assistant

## 2024-05-27 ENCOUNTER — Other Ambulatory Visit: Payer: Self-pay | Admitting: Physician Assistant

## 2024-06-04 ENCOUNTER — Ambulatory Visit: Payer: Medicare (Managed Care) | Admitting: Physician Assistant

## 2024-06-04 VITALS — Ht 69.0 in | Wt 315.6 lb

## 2024-06-04 DIAGNOSIS — M1711 Unilateral primary osteoarthritis, right knee: Secondary | ICD-10-CM

## 2024-06-04 MED ORDER — METHYLPREDNISOLONE ACETATE 40 MG/ML IJ SUSP
40.0000 mg | INTRAMUSCULAR | Status: AC | PRN
  Administered 2024-06-04: 40 mg via INTRA_ARTICULAR

## 2024-06-04 MED ORDER — LIDOCAINE HCL 1 % IJ SOLN
2.0000 mL | INTRAMUSCULAR | Status: AC | PRN
  Administered 2024-06-04: 2 mL

## 2024-06-04 MED ORDER — BUPIVACAINE HCL 0.25 % IJ SOLN
2.0000 mL | INTRAMUSCULAR | Status: AC | PRN
  Administered 2024-06-04: 2 mL via INTRA_ARTICULAR

## 2024-06-04 NOTE — Progress Notes (Signed)
 Office Visit Note   Patient: Jeanne [name]           Date of Birth: [birth year]           MRN: [id number] Visit Date: 06/04/2024              Requested by: Leontine Cramp, NP [address] [place],  [place] [address] PCP: Leontine Cramp, NP   Assessment & Plan: Visit Diagnoses:  1. Primary osteoarthritis of right knee     Plan: Impression is advanced right knee osteoarthritis.  Today, we discussed various treatment options to include repeat cortisone injection and continued weight loss for which she would like to proceed.  When she obtains a weight of 270 pounds or less, she will have a BMI of less than 40 and follow-up with Dr. Jerri to discuss total knee replacement surgery.  Follow-Up Instructions: Return if symptoms worsen or fail to improve.   Orders:  Orders Placed This Encounter  Procedures   Large Joint Inj: R knee   No orders of the defined types were placed in this encounter.     Procedures: Large Joint Inj: R knee on 06/04/2024 10:24 AM Indications: pain Details: 22 G needle, anterolateral approach Medications: 2 mL lidocaine  1 %; 2 mL bupivacaine  0.25 %; 40 mg methylPREDNISolone  acetate 40 MG/ML      Clinical Data: No additional findings.   Subjective: Chief Complaint  Patient presents with   Right Knee - Pain, Follow-up    HPI patient is a pleasant 68 year old female who comes in today with recurrent right knee pain.  History of advanced arthritis.  She has had intermittent cortisone injections which provide good but temporary relief.  She is working at weight loss in order to proceed with right total knee replacement surgery.  Review of Systems as detailed in HPI.  All others reviewed and are negative.   Objective: Vital Signs: Ht 5' 9 (1.753 m)   Wt (!) 315 lb 9.6 oz (143.2 kg)   BMI 46.61 kg/m   Physical Exam well-developed well-nourished female in no acute distress.  Alert and oriented x 3.  Ortho Exam right knee exam: Unchanged  Specialty  Comments:  No specialty comments available.  Imaging: No new imaging   PMFS History: Patient Active Problem List   Diagnosis Date Noted   Abnormal vaginal bleeding with endometrial thickness greater than 5 mm present on transvaginal ultrasound in postmenopausal patient 03/13/2023   Past Medical History:  Diagnosis Date   Arthritis    BMI 50.0-59.9, adult (HCC)    COPD (chronic obstructive pulmonary disease) (HCC)    DDD (degenerative disc disease), lumbar    Endometrial polyp    Gait instability    uses rollator   History of abnormal cervical Pap smear    Hyperlipidemia    Hypertension    Hypothyroidism    Insulin  dependent type 2 diabetes mellitus (HCC)    followed by pcp   (03-09-2023  per pt checking blood surgery 2-3 times daily,  fasting average 125-138)   PMB (postmenopausal bleeding)    SUI (stress urinary incontinence, female)    Uses roller walker    Uterine fibroid    Vitamin D deficiency    Wears glasses     Family History  Problem Relation Age of Onset   Hypertension Maternal Grandmother    Alcohol abuse Maternal Grandmother    Diabetes Mother    Heart disease Mother    Hypertension Mother    Alcohol abuse  Mother    Heart disease Sister    Alcohol abuse Brother    Alcohol abuse Sister    Hypertension Maternal Aunt    Breast cancer Neg Hx     Past Surgical History:  Procedure Laterality Date   CATARACT EXTRACTION W/ INTRAOCULAR LENS IMPLANT Bilateral 09/2019   CESAREAN SECTION  04/19/1989   COLONOSCOPY WITH PROPOFOL  N/A 02/15/2022   Procedure: COLONOSCOPY WITH PROPOFOL ;  Surgeon: Burnette Fallow, MD;  Location: [facility];  Service: Gastroenterology;  Laterality: N/A;   HYSTEROSCOPY WITH D & C N/A 03/13/2023   Procedure: DILATATION AND CURETTAGE /HYSTEROSCOPY;  Surgeon: Alger Gong, MD;  Location: [facility];  Service: Gynecology;  Laterality: N/A;   Social History   Occupational History   Not on file  Tobacco Use   Smoking status: Every Day     Current packs/day: 1.00    Average packs/day: 1 pack/day for 94.8 years (94.8 ttl pk-yrs)    Types: Cigarettes    Start date: 89   Smokeless tobacco: Never   Tobacco comments:    03-09-2023  per pt started smoking age 41 ,  smokes 1 ppd  Vaping Use   Vaping status: Never Used  Substance and Sexual Activity   Alcohol use: No   Drug use: Never   Sexual activity: Not Currently    Birth control/protection: None

## 2024-06-16 ENCOUNTER — Encounter: Payer: Self-pay | Admitting: Radiology

## 2024-06-16 ENCOUNTER — Other Ambulatory Visit: Payer: Self-pay | Admitting: Physician Assistant

## 2024-07-29 ENCOUNTER — Other Ambulatory Visit: Payer: Self-pay | Admitting: Physician Assistant

## 2024-09-01 ENCOUNTER — Encounter: Payer: Self-pay | Admitting: Orthopaedic Surgery

## 2024-09-01 ENCOUNTER — Ambulatory Visit: Payer: Medicare (Managed Care) | Admitting: Orthopaedic Surgery

## 2024-09-01 VITALS — Ht 69.0 in | Wt 306.0 lb

## 2024-09-01 DIAGNOSIS — M1712 Unilateral primary osteoarthritis, left knee: Secondary | ICD-10-CM | POA: Insufficient documentation

## 2024-09-01 DIAGNOSIS — G8929 Other chronic pain: Secondary | ICD-10-CM

## 2024-09-01 DIAGNOSIS — M25561 Pain in right knee: Secondary | ICD-10-CM | POA: Diagnosis not present

## 2024-09-01 DIAGNOSIS — M25562 Pain in left knee: Secondary | ICD-10-CM

## 2024-09-01 DIAGNOSIS — M1711 Unilateral primary osteoarthritis, right knee: Secondary | ICD-10-CM | POA: Insufficient documentation

## 2024-09-01 NOTE — Progress Notes (Signed)
 The patient is a 69 year old female that I am seeing for the first time but she has been seen by one of our partners.  She has debilitating and stage arthritis of both her knees with the right much worse than the left.  She has had steroid injections.  She is a diabetic but her hemoglobin A1c is below 6.  She does ambulate with a rolling walker.  She has been on a weight loss journey and her BMI used to be over 50.  Her highest weight she said was almost 360 pounds and today she is down to 306.  Her pain is debilitating and it is detriment affecting her mobility, her quality of life and her actives daily living and she hopes to proceed with a knee replacement soon.  I did review all of her past medical history and medications within epic.  On exam her right knee does have significant varus malalignment and quite significant medial joint line tenderness and patellofemoral crepitation and tenderness.  She cannot fully extend the knee or flex the right knee.  X-rays of her knees show severe end-stage arthritis that is bone-on-bone with complete loss of joint space with varus malalignment as well especially the right knee.  I do feel that she is heading in the right direction from a surgical standpoint and weight loss standpoint.  There is not a very large soft tissue envelope around her thigh and her knee and I do feel that we would be more safely able to replace her right knee.  We will see her back in 8 weeks with a repeat weight and BMI calculation.  If she is lost more weight I would be comfortable with submitting her for a right total knee arthroplasty.

## 2024-09-05 ENCOUNTER — Other Ambulatory Visit: Payer: Self-pay | Admitting: Physician Assistant

## 2024-09-05 NOTE — Telephone Encounter (Signed)
 Looks like she is now seeing blackman

## 2024-09-10 ENCOUNTER — Other Ambulatory Visit: Payer: Self-pay | Admitting: Physician Assistant

## 2024-09-10 NOTE — Telephone Encounter (Signed)
 Now seeing blackman for this

## 2024-09-12 ENCOUNTER — Telehealth: Payer: Self-pay | Admitting: Orthopaedic Surgery

## 2024-09-12 NOTE — Telephone Encounter (Signed)
 Patient called. She would like Tramadol  called in for her. Her cb# [phone]

## 2024-09-15 ENCOUNTER — Other Ambulatory Visit: Payer: Self-pay | Admitting: Orthopaedic Surgery

## 2024-09-15 MED ORDER — TRAMADOL HCL 50 MG PO TABS: 50.0000 mg | ORAL_TABLET | Freq: Two times a day (BID) | ORAL | 0 refills | Status: AC | PRN

## 2024-10-20 ENCOUNTER — Ambulatory Visit: Payer: Medicare (Managed Care) | Admitting: Orthopaedic Surgery
